# Patient Record
Sex: Female | Born: 1965 | Race: White | Hispanic: No | Marital: Single | Smoking: Never smoker
Health system: Southern US, Community
[De-identification: ages and names within clinical notes are randomized; demographics above are authoritative.]

## PROBLEM LIST (undated history)

## (undated) DIAGNOSIS — G510 Bell's palsy: Secondary | ICD-10-CM

## (undated) DIAGNOSIS — R42 Dizziness and giddiness: Secondary | ICD-10-CM

## (undated) DIAGNOSIS — Z8619 Personal history of other infectious and parasitic diseases: Secondary | ICD-10-CM

## (undated) DIAGNOSIS — C44300 Unspecified malignant neoplasm of skin of unspecified part of face: Secondary | ICD-10-CM

## (undated) DIAGNOSIS — J329 Chronic sinusitis, unspecified: Secondary | ICD-10-CM

## (undated) DIAGNOSIS — J3089 Other allergic rhinitis: Secondary | ICD-10-CM

## (undated) DIAGNOSIS — E7800 Pure hypercholesterolemia, unspecified: Secondary | ICD-10-CM

## (undated) HISTORY — DX: Pure hypercholesterolemia, unspecified: E78.00

## (undated) HISTORY — DX: Dizziness and giddiness: R42

## (undated) HISTORY — DX: Chronic sinusitis, unspecified: J32.9

## (undated) HISTORY — DX: Personal history of other infectious and parasitic diseases: Z86.19

## (undated) HISTORY — DX: Other allergic rhinitis: J30.89

## (undated) HISTORY — DX: Unspecified malignant neoplasm of skin of unspecified part of face: C44.300

---
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
## 1999-10-20 ENCOUNTER — Other Ambulatory Visit: Admission: RE | Admit: 1999-10-20 | Discharge: 1999-10-20 | Payer: Self-pay | Admitting: Family Medicine

## 2008-03-26 HISTORY — PX: SHOULDER SURGERY: SHX246

## 2012-06-25 DIAGNOSIS — E7800 Pure hypercholesterolemia, unspecified: Secondary | ICD-10-CM

## 2012-06-25 HISTORY — DX: Pure hypercholesterolemia, unspecified: E78.00

## 2012-08-28 ENCOUNTER — Encounter: Payer: Self-pay | Admitting: Certified Nurse Midwife

## 2012-10-12 ENCOUNTER — Encounter: Payer: Self-pay | Admitting: Certified Nurse Midwife

## 2012-10-12 ENCOUNTER — Ambulatory Visit (INDEPENDENT_AMBULATORY_CARE_PROVIDER_SITE_OTHER): Payer: Federal, State, Local not specified - PPO | Admitting: Certified Nurse Midwife

## 2012-10-12 VITALS — BP 100/64 | Ht 70.25 in | Wt 183.0 lb

## 2012-10-12 DIAGNOSIS — Z0000 Encounter for general adult medical examination without abnormal findings: Secondary | ICD-10-CM

## 2012-10-12 NOTE — Progress Notes (Addendum)
47 y.o. SingleCaucasian female   G0P0000 here for annual exam.    Patient's last menstrual period was 09/25/2012.          Sexually active: no  The current method of family planning is none.    Exercising: no  Home exercise routine includes none. Last mammogram:  11/02/2011 Last pap smear:10/12/11 Smoking:no Alcohol:no Last colonoscopy:no Last Bone Density:  no  Hgb:   pcp             Urine:pcp    No health maintenance topics applied.  Family History  Problem Relation Age of Onset  . Hypertension Father   . Gout Father   . Heart disease Mother   . Stroke Mother     There is no problem list on file for this patient.   History reviewed. No pertinent past medical history.  Past Surgical History  Procedure Laterality Date  . Shoulder surgery Left 03/26/2008  . Urethra surgery  07/26/1970    Allergies: Nyquil  Current Outpatient Prescriptions  Medication Sig Dispense Refill  . cetirizine (ZYRTEC) 10 MG tablet Take 10 mg by mouth daily.      . pravastatin (PRAVACHOL) 10 MG tablet Take 10 mg by mouth daily.       No current facility-administered medications for this visit.    ROS: Pertinent items are noted in HPI.  Exam:    BP 100/64  Ht 5' 10.25" (1.784 m)  Wt 183 lb (83.008 kg)  BMI 26.08 kg/m2  LMP 09/25/2012   Wt Readings from Last 3 Encounters:  10/12/12 183 lb (83.008 kg)     Ht Readings from Last 3 Encounters:  10/12/12 5' 10.25" (1.784 m)    General appearance: alert, cooperative and appears stated age Head: Normocephalic, without obvious abnormality, atraumatic Neck: no adenopathy, supple, symmetrical, trachea midline and thyroid not enlarged, symmetric, no tenderness/mass/nodules Lungs: clear to auscultation bilaterally Breasts: Inspection negative, No nipple retraction or dimpling, No nipple discharge or bleeding, No axillary or supraclavicular adenopathy, Normal to palpation without dominant masses Heart: regular rate and rhythm Abdomen: soft,  non-tender; bowel sounds normal; no masses,  no organomegaly Extremities: extremities normal, atraumatic, no cyanosis or edema Skin: Skin color, texture, turgor normal. No rashes or lesions Lymph nodes: Cervical, supraclavicular, and axillary nodes normal. No abnormal inguinal nodes palpated Neurologic: Grossly normal   Pelvic: External genitalia:  no lesions              Urethra:  normal appearing urethra with no masses, tenderness or lesions              Bartholins and Skenes: normal                 Vagina: normal appearing vagina with normal color and discharge, no lesions              Cervix: normal appearance              Pap taken: no        Bimanual Exam:  Uterus:  uterus is normal size, shape, consistency and nontender                                      Adnexa: normal adnexa in size, nontender and no masses  Rectovaginal: Confirms                                      Anus:  normal sphincter tone, no lesions  A: well woman exam     P:Reviewed health and wellness per patient instructions.  Mammogram in April 2014 pap smear return annually or prn     An After Visit Summary was printed and given to the patient.   Reviewed, TL

## 2013-05-01 ENCOUNTER — Ambulatory Visit (INDEPENDENT_AMBULATORY_CARE_PROVIDER_SITE_OTHER): Payer: Federal, State, Local not specified - PPO | Admitting: Podiatry

## 2013-05-01 ENCOUNTER — Encounter: Payer: Self-pay | Admitting: Podiatry

## 2013-05-01 VITALS — BP 122/76 | HR 64 | Temp 97.6°F | Resp 12 | Ht 71.0 in | Wt 174.0 lb

## 2013-05-01 DIAGNOSIS — B353 Tinea pedis: Secondary | ICD-10-CM | POA: Insufficient documentation

## 2013-05-01 NOTE — Progress Notes (Signed)
Olivia Hill presents today for her second laser toenail procedure to the second digit of the right foot. She states it seems like it is getting better. She states it feels much better than it did.  Objective: Second toe demonstrates some nail change proximally. There does seem to be some improvement.  Assessment: Visible improvement of the nail plate second digit right foot.  Plan: Second laser treatment today consists of 300 shocks to the second digit right foot. Patient tolerated this well. We'll followup with her in 5 months for her third round of laser.

## 2013-10-02 ENCOUNTER — Ambulatory Visit (INDEPENDENT_AMBULATORY_CARE_PROVIDER_SITE_OTHER): Payer: Federal, State, Local not specified - PPO | Admitting: Podiatry

## 2013-10-02 DIAGNOSIS — M79609 Pain in unspecified limb: Secondary | ICD-10-CM

## 2013-10-02 NOTE — Progress Notes (Signed)
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 She states it really doesn't seem to be doing very well with the laser therapy. I am considering not doing it today. Objective evaluation reveals that there is no change in nail plates as initiation of laser therapy. At this point I suggested that laser therapy would probably be less than effective and suggested other means of debridement. We will followup with her as needed.

## 2013-10-15 ENCOUNTER — Ambulatory Visit (INDEPENDENT_AMBULATORY_CARE_PROVIDER_SITE_OTHER): Payer: Federal, State, Local not specified - PPO | Admitting: Certified Nurse Midwife

## 2013-10-15 ENCOUNTER — Encounter: Payer: Self-pay | Admitting: Certified Nurse Midwife

## 2013-10-15 VITALS — BP 111/70 | HR 66 | Resp 16 | Ht 71.0 in | Wt 188.0 lb

## 2013-10-15 DIAGNOSIS — Z01419 Encounter for gynecological examination (general) (routine) without abnormal findings: Secondary | ICD-10-CM

## 2013-10-15 DIAGNOSIS — Z0000 Encounter for general adult medical examination without abnormal findings: Secondary | ICD-10-CM

## 2013-10-15 LAB — POCT URINALYSIS DIPSTICK
Bilirubin, UA: NEGATIVE
Blood, UA: NEGATIVE
Glucose, UA: NEGATIVE
Ketones, UA: NEGATIVE
Leukocytes, UA: NEGATIVE
Nitrite, UA: NEGATIVE
Protein, UA: NEGATIVE
Urobilinogen, UA: NEGATIVE
pH, UA: 5

## 2013-10-15 NOTE — Patient Instructions (Signed)

## 2013-10-15 NOTE — Progress Notes (Signed)
Reviewed personally.  M. Suzanne Naamah Boggess, MD.  

## 2013-10-15 NOTE — Progress Notes (Signed)
48 y.o. G0P0000 Single Caucasian Fe here for annual exam. Perimenopausal with regular periods until this period. Duration 5-8 days, heavy with light bleeding until stopped. Some hot flashes, and night sweats, but no insomnia issues. Working on establishing a exercise and diet program once she is in [place]. She will be relocating for the next year and is so excited! Sees PCP with employment for aex, labs.  No other health issues today.  Patient's last menstrual period was 09/03/2013.          Sexually active: no  The current method of family planning is none.    Exercising: no  exercise Smoker:  no  Health Maintenance: Pap: 10-12-11 neg HPV HR neg MMG:  11-16-2012 normal Colonoscopy: none BMD:   none TDaP:  2013 Labs: Poct urine-neg, Hgb-13.3 Self breast exam: done occ   reports that she has never smoked. She does not have any smokeless tobacco history on file. She reports that she does not drink alcohol or use illicit drugs.  Past Medical History  Diagnosis Date  . Hypercholesterolemia 12/13    on medication    Past Surgical History  Procedure Laterality Date  . Shoulder surgery Left 03/26/2008  . Urethra surgery  07/26/1970    Current Outpatient Prescriptions  Medication Sig Dispense Refill  . cetirizine (ZYRTEC) 10 MG tablet Take 10 mg by mouth daily.       No current facility-administered medications for this visit.    Family History  Problem Relation Age of Onset  . Hypertension Father   . Gout Father   . Heart disease Mother   . Stroke Mother     ROS:  Pertinent items are noted in HPI.  Otherwise, a comprehensive ROS was negative.  Exam:   BP 111/70  Pulse 66  Resp 16  Ht 5\' 11"  (1.803 m)  Wt 188 lb (85.276 kg)  BMI 26.23 kg/m2  LMP 09/03/2013 Height: 5\' 11"  (180.3 cm)  Ht Readings from Last 3 Encounters:  10/15/13 5\' 11"  (1.803 m)  05/01/13 5\' 11"  (1.803 m)  10/12/12 5' 10.25" (1.784 m)    General appearance: alert, cooperative and appears stated  age Head: Normocephalic, without obvious abnormality, atraumatic Neck: no adenopathy, supple, symmetrical, trachea midline and thyroid normal to inspection and palpation and non-palpable Lungs: clear to auscultation bilaterally Breasts: normal appearance, no masses or tenderness, No nipple retraction or dimpling, No nipple discharge or bleeding, No axillary or supraclavicular adenopathy Heart: regular rate and rhythm Abdomen: soft, non-tender; no masses,  no organomegaly Extremities: extremities normal, atraumatic, no cyanosis or edema Skin: Skin color, texture, turgor normal. No rashes or lesions Lymph nodes: Cervical, supraclavicular, and axillary nodes normal. No abnormal inguinal nodes palpated Neurologic: Grossly normal   Pelvic: External genitalia:  no lesions              Urethra:  normal appearing urethra with no masses, tenderness or lesions              Bartholin's and Skene's: normal                 Vagina: normal appearing vagina with normal color and discharge, no lesions              Cervix: normal,non tender              Pap taken: yes Bimanual Exam:  Uterus:  normal size, contour, position, consistency, mobility, non-tender and anteverted  Adnexa: normal adnexa and no mass, fullness, tenderness               Rectovaginal: Confirms               Anus:  normal sphincter tone, no lesions  A:  Well Woman with normal exam  Contraception not needed, female/female relationship  Perimenopausal with cycle changes, no amenorrhea  P:   Reviewed health and wellness pertinent to exam  Discussed continue keeping menses record with normal and abnormal parameters. Patient to advise if any changes.  Pap smear as per guidelines   Mammogram yearly pap smear taken today with reflex counseled on mammography screening, adequate intake of calcium and vitamin D, diet and exercise encouraged, with walking and bike riding in [place].  return annually or prn Wished well on  her relocation.  An After Visit Summary was printed and given to the patient.

## 2013-10-17 LAB — IPS PAP TEST WITH REFLEX TO HPV

## 2013-10-23 ENCOUNTER — Ambulatory Visit (INDEPENDENT_AMBULATORY_CARE_PROVIDER_SITE_OTHER): Payer: Federal, State, Local not specified - PPO | Admitting: Podiatry

## 2013-10-23 VITALS — BP 119/73 | HR 70 | Resp 16

## 2013-10-23 DIAGNOSIS — B351 Tinea unguium: Secondary | ICD-10-CM

## 2013-10-23 DIAGNOSIS — M79609 Pain in unspecified limb: Secondary | ICD-10-CM

## 2013-10-23 MED ORDER — TAVABOROLE 5 % EX SOLN: CUTANEOUS | Status: DC

## 2013-10-23 NOTE — Progress Notes (Signed)
She presents today for a laser treatment in digital nail plate right foot. The nail was debrided and thin. 500 shocks from the laser was utilized. She was also given a prescription for [facility]. I will followup with her in about 4 months

## 2014-11-01 ENCOUNTER — Ambulatory Visit (INDEPENDENT_AMBULATORY_CARE_PROVIDER_SITE_OTHER): Payer: Federal, State, Local not specified - PPO | Admitting: Certified Nurse Midwife

## 2014-11-01 ENCOUNTER — Encounter: Payer: Self-pay | Admitting: Certified Nurse Midwife

## 2014-11-01 VITALS — BP 108/80 | HR 68 | Resp 16 | Ht 70.5 in | Wt 197.0 lb

## 2014-11-01 DIAGNOSIS — Z0000 Encounter for general adult medical examination without abnormal findings: Secondary | ICD-10-CM | POA: Diagnosis not present

## 2014-11-01 DIAGNOSIS — Z01419 Encounter for gynecological examination (general) (routine) without abnormal findings: Secondary | ICD-10-CM

## 2014-11-01 DIAGNOSIS — N912 Amenorrhea, unspecified: Secondary | ICD-10-CM | POA: Diagnosis not present

## 2014-11-01 DIAGNOSIS — N951 Menopausal and female climacteric states: Secondary | ICD-10-CM | POA: Diagnosis not present

## 2014-11-01 LAB — COMPREHENSIVE METABOLIC PANEL
ALT: 22 U/L (ref 0–35)
AST: 18 U/L (ref 0–37)
Albumin: 4.5 g/dL (ref 3.5–5.2)
Alkaline Phosphatase: 95 U/L (ref 39–117)
BUN: 16 mg/dL (ref 6–23)
CO2: 28 mEq/L (ref 19–32)
Calcium: 9.6 mg/dL (ref 8.4–10.5)
Chloride: 103 mEq/L (ref 96–112)
Creat: 0.86 mg/dL (ref 0.50–1.10)
Glucose, Bld: 82 mg/dL (ref 70–99)
Potassium: 4.1 mEq/L (ref 3.5–5.3)
Sodium: 140 mEq/L (ref 135–145)
Total Bilirubin: 0.4 mg/dL (ref 0.2–1.2)
Total Protein: 7.1 g/dL (ref 6.0–8.3)

## 2014-11-01 LAB — POCT URINALYSIS DIPSTICK
Bilirubin, UA: NEGATIVE
Blood, UA: NEGATIVE
Glucose, UA: NEGATIVE
Ketones, UA: NEGATIVE
Leukocytes, UA: NEGATIVE
Nitrite, UA: NEGATIVE
Protein, UA: NEGATIVE
Urobilinogen, UA: NEGATIVE
pH, UA: 5

## 2014-11-01 LAB — TSH: TSH: 2.495 u[IU]/mL (ref 0.350–4.500)

## 2014-11-01 LAB — LIPID PANEL
Cholesterol: 219 mg/dL — ABNORMAL HIGH (ref 0–200)
HDL: 40 mg/dL — ABNORMAL LOW (ref >=46)
LDL Cholesterol: 134 mg/dL — ABNORMAL HIGH (ref 0–99)
Total CHOL/HDL Ratio: 5.5 Ratio
Triglycerides: 223 mg/dL — ABNORMAL HIGH (ref <150)
VLDL: 45 mg/dL — ABNORMAL HIGH (ref 0–40)

## 2014-11-01 NOTE — Patient Instructions (Signed)

## 2014-11-01 NOTE — Progress Notes (Signed)
49 y.o. G0P0000 Single  Caucasian Fe here for annual exam. Periods normal until 12/15. Has had no period since then. Occasional hot flashes and night sweats.  No vaginal dryness. Patient now back in [place] after living and working in [place] for the past year. Sees PCP prn does not go annually. Request screening labs. Patient has noted a ? Fat change on right inner thigh. Not tender, no redness, just looks different, but has gained weight since living in [place]. No pain or tenderness. No other health issues today.  Patient's last menstrual period was 06/25/2014.          Sexually active: No.  The current method of family planning is abstinence.    Exercising: Yes.    Walking 2-7 miles a day  Smoker:  no  Health Maintenance: Pap: 10/15/13 Neg MMG: 10/2014 Normal  Self Breast Check: yes, occ Colonoscopy:  Never BMD:   Never TDaP: 2013  Labs:  Poct urine-neg, Hgb: 13.8   reports that she has never smoked. She has never used smokeless tobacco. She reports that she does not drink alcohol or use illicit drugs.  Past Medical History  Diagnosis Date  . Hypercholesterolemia 12/13    on medication  . Skin cancer of face     cryotherapy done fall 2015    Past Surgical History  Procedure Laterality Date  . Shoulder surgery Left 03/26/2008  . Urethra surgery  07/26/1970    Current Outpatient Prescriptions  Medication Sig Dispense Refill  . cetirizine (ZYRTEC) 10 MG tablet Take 10 mg by mouth daily.     No current facility-administered medications for this visit.    Family History  Problem Relation Age of Onset  . Hypertension Father   . Gout Father   . Heart disease Mother   . Stroke Mother     ROS:  Pertinent items are noted in HPI.  Otherwise, a comprehensive ROS was negative.  Exam:   BP 108/80 mmHg  Pulse 68  Resp 16  Ht 5' 10.5" (1.791 m)  Wt 197 lb (89.359 kg)  BMI 27.86 kg/m2  LMP 06/25/2014 Height: 5' 10.5" (179.1 cm) Ht Readings from Last 3 Encounters:  11/01/14 5'  10.5" (1.791 m)  10/15/13 5\' 11"  (1.803 m)  05/01/13 5\' 11"  (1.803 m)    General appearance: alert, cooperative and appears stated age Head: Normocephalic, without obvious abnormality, atraumatic Neck: no adenopathy, supple, symmetrical, trachea midline and thyroid normal to inspection and palpation Lungs: clear to auscultation bilaterally Breasts: normal appearance, no masses or tenderness, No nipple retraction or dimpling, No nipple discharge or bleeding, No axillary or supraclavicular adenopathy Heart: regular rate and rhythm Abdomen: soft, non-tender; no masses,  no organomegaly Extremities: extremities normal, atraumatic, no cyanosis or edema, no lipoma noted on inner right thigh just normal appearance. Pulses equal and no edema noted.  Skin: Skin color, texture, turgor normal. No rashes or lesions Lymph nodes: Cervical, supraclavicular, and axillary nodes normal. No abnormal inguinal nodes palpated Neurologic: Grossly normal   Pelvic: External genitalia:  no lesions              Urethra:  normal appearing urethra with no masses, tenderness or lesions              Bartholin's and Skene's: normal                 Vagina: normal appearing vagina with normal color and discharge, no lesions  Cervix: normal,non tender, no lesions              Pap taken: No. Bimanual Exam:  Uterus:  normal size, contour, position, consistency, mobility, non-tender              Adnexa: normal adnexa and no mass, fullness, tenderness               Rectovaginal: Confirms               Anus:  normal sphincter tone, no lesions    A:  Well Woman with normal exam  Contraception, not sexually active ever  Perimenopausal with amenorrhea  Normal leg appearance and color  Screening labs    P:   Reviewed health and wellness pertinent to exam  Discussed perimenopausal changes and etiology. Discussed expectations with cycle changes. Discussed Thyroid and Pituitary and weight change can also cause  cycle changes. Recommend lab work for further evaluation. Agreeable. Discussed Provera challenge if needed. Patient agreeable. Continue menses calendar.  Labs TSH,FSH,Prolactin, Lipid panel, CMP, Vitamin D  Pap smear not taken today  Reassured leg has normal appearance, will come in if change   counseled on breast self exam, mammography screening, menopause, adequate intake of calcium and vitamin D, diet and exercise  return annually or prn  An After Visit Summary was printed and given to the patient.

## 2014-11-02 ENCOUNTER — Other Ambulatory Visit: Payer: Self-pay | Admitting: Certified Nurse Midwife

## 2014-11-02 DIAGNOSIS — R899 Unspecified abnormal finding in specimens from other organs, systems and tissues: Secondary | ICD-10-CM

## 2014-11-02 DIAGNOSIS — N912 Amenorrhea, unspecified: Secondary | ICD-10-CM

## 2014-11-02 LAB — FOLLICLE STIMULATING HORMONE: FSH: 77.8 m[IU]/mL

## 2014-11-02 LAB — VITAMIN D 25 HYDROXY (VIT D DEFICIENCY, FRACTURES): Vit D, 25-Hydroxy: 17 ng/mL — ABNORMAL LOW (ref 30–100)

## 2014-11-02 LAB — PROLACTIN: Prolactin: 5.2 ng/mL

## 2014-11-02 MED ORDER — MEDROXYPROGESTERONE ACETATE 10 MG PO TABS: 10.0000 mg | ORAL_TABLET | Freq: Every day | ORAL | Status: DC

## 2014-11-04 ENCOUNTER — Ambulatory Visit: Payer: Federal, State, Local not specified - PPO | Admitting: Certified Nurse Midwife

## 2014-11-04 ENCOUNTER — Telehealth: Payer: Self-pay

## 2014-11-04 DIAGNOSIS — E559 Vitamin D deficiency, unspecified: Secondary | ICD-10-CM

## 2014-11-04 LAB — HEMOGLOBIN, FINGERSTICK: Hemoglobin, fingerstick: 13.8 g/dL (ref 12.0–16.0)

## 2014-11-04 NOTE — Telephone Encounter (Signed)
Lmtcb. Needed to give patient lab results

## 2014-11-04 NOTE — Telephone Encounter (Signed)
Returning call.

## 2014-11-05 ENCOUNTER — Other Ambulatory Visit: Payer: Self-pay

## 2014-11-05 DIAGNOSIS — E559 Vitamin D deficiency, unspecified: Secondary | ICD-10-CM

## 2014-11-05 MED ORDER — VITAMIN D (ERGOCALCIFEROL) 1.25 MG (50000 UNIT) PO CAPS: 50000.0000 [IU] | ORAL_CAPSULE | ORAL | Status: DC

## 2014-11-05 NOTE — Telephone Encounter (Signed)
Patient notified of results as written by provider 

## 2014-11-08 ENCOUNTER — Telehealth: Payer: Self-pay | Admitting: Certified Nurse Midwife

## 2014-11-08 NOTE — Telephone Encounter (Signed)
Spoke with patient. She states at recent visit, Debbi assessed an area on inner right thigh that she was advised is normal in appearance. Patient states that she is traveling to [place] on 12/03/14 and wondering if long flight is safe. Advised patient if has concerns about area, can be evaluated by PCP to advise if air travel is safe or is needs imaging to area. Patient states she is okay with doing nothing but wanted provider opinion.   Advised would send to provider and return call with response.

## 2014-11-08 NOTE — Telephone Encounter (Signed)
Patient has a question for a nurse,no details given. Last seen 11/01/14.

## 2014-11-10 NOTE — Progress Notes (Signed)
Reviewed personally.  M. Suzanne Evanell Redlich, MD.  

## 2014-11-11 NOTE — Telephone Encounter (Signed)
I discussed with patient uncertain as to what this was ? Lipoma, would need to see PCP for evaluation if any change.

## 2014-11-11 NOTE — Telephone Encounter (Signed)
Spoke with patient and message from Regina Eck CNM given.  Patient agreeable. She will schedule appointment to see PCP for evaluation.  Routing to provider for final review. Patient agreeable to disposition. Will close encounter

## 2014-11-28 ENCOUNTER — Telehealth: Payer: Self-pay | Admitting: Certified Nurse Midwife

## 2014-11-28 NOTE — Telephone Encounter (Signed)
Spoke with patient. Patient states that she complete taking Provera 14 days ago today and has not had any bleeding. Advised patient will let Regina Eck CNM know and return call with any further recommendations. Patient is agreeable.

## 2014-11-28 NOTE — Telephone Encounter (Signed)
Patient calling to update nurse about the medicine she took to start her menstrual cycle. She reports, "Nothing happened and I am okay with that."

## 2014-11-28 NOTE — Telephone Encounter (Signed)
Patient needs to continue menstrual calendar and advise if no period in 3 months.

## 2014-11-29 NOTE — Telephone Encounter (Signed)
Spoke with patient. Advised of message as seen below from [place]. Patient is agreeable and verbalizes understanding.  Routing to provider for final review. Patient agreeable to disposition. Patient aware provider will review message and nurse will return call with any additional instructions or change of disposition. Will close encounter.

## 2014-11-29 NOTE — Telephone Encounter (Signed)
agree

## 2015-02-04 ENCOUNTER — Other Ambulatory Visit (INDEPENDENT_AMBULATORY_CARE_PROVIDER_SITE_OTHER): Payer: Federal, State, Local not specified - PPO

## 2015-02-04 DIAGNOSIS — E559 Vitamin D deficiency, unspecified: Secondary | ICD-10-CM

## 2015-02-04 DIAGNOSIS — R899 Unspecified abnormal finding in specimens from other organs, systems and tissues: Secondary | ICD-10-CM

## 2015-02-04 LAB — LIPID PANEL
Cholesterol: 228 mg/dL — ABNORMAL HIGH (ref 0–200)
HDL: 44 mg/dL — ABNORMAL LOW (ref >=46)
LDL Cholesterol: 153 mg/dL — ABNORMAL HIGH (ref 0–99)
Total CHOL/HDL Ratio: 5.2 Ratio
Triglycerides: 153 mg/dL — ABNORMAL HIGH (ref <150)
VLDL: 31 mg/dL (ref 0–40)

## 2015-02-05 ENCOUNTER — Telehealth: Payer: Self-pay

## 2015-02-05 LAB — VITAMIN D 25 HYDROXY (VIT D DEFICIENCY, FRACTURES): Vit D, 25-Hydroxy: 31 ng/mL (ref 30–100)

## 2015-02-05 NOTE — Telephone Encounter (Signed)
I spoke to patient & told her to call her pcp office & have them call them & get her an earlier appt. Patient agreed to do that.

## 2015-02-05 NOTE — Telephone Encounter (Signed)
I called patient & gave her lab results. Pt states she has a pcp that is moving to [place] next week & she had an u/s done on her thyroid that showed cyst. Pt states that the earliest endocrinologist appt she could get is in 49mths. Pt asking if you could maybe get her into see an endocrinologist sooner. Please advise.

## 2015-02-05 NOTE — Telephone Encounter (Signed)
Because they saw her they would have to do the referral.

## 2015-02-14 DIAGNOSIS — E041 Nontoxic single thyroid nodule: Secondary | ICD-10-CM | POA: Insufficient documentation

## 2015-04-02 DIAGNOSIS — Z8709 Personal history of other diseases of the respiratory system: Secondary | ICD-10-CM | POA: Insufficient documentation

## 2015-04-02 DIAGNOSIS — J329 Chronic sinusitis, unspecified: Secondary | ICD-10-CM

## 2015-04-09 ENCOUNTER — Telehealth: Payer: Self-pay | Admitting: Certified Nurse Midwife

## 2015-04-09 NOTE — Telephone Encounter (Signed)
Patient calling to give update on her cycle, patient says she just finished spotting that started 04/04/15. She wanted Ms. Debbie to know.

## 2015-04-10 NOTE — Telephone Encounter (Signed)
Please call patient and have her advise if no period in next 3 months

## 2015-04-10 NOTE — Telephone Encounter (Signed)
Left message on machine letting patient know. DPR signed.

## 2015-05-09 ENCOUNTER — Ambulatory Visit: Payer: Federal, State, Local not specified - PPO | Admitting: Allergy and Immunology

## 2015-06-04 ENCOUNTER — Ambulatory Visit (INDEPENDENT_AMBULATORY_CARE_PROVIDER_SITE_OTHER): Payer: Federal, State, Local not specified - PPO | Admitting: Internal Medicine

## 2015-06-04 ENCOUNTER — Encounter: Payer: Self-pay | Admitting: Internal Medicine

## 2015-06-04 VITALS — BP 118/82 | Temp 97.9°F | Ht 70.0 in | Wt 202.6 lb

## 2015-06-04 DIAGNOSIS — E041 Nontoxic single thyroid nodule: Secondary | ICD-10-CM | POA: Diagnosis not present

## 2015-06-04 DIAGNOSIS — R0683 Snoring: Secondary | ICD-10-CM

## 2015-06-04 DIAGNOSIS — J3089 Other allergic rhinitis: Secondary | ICD-10-CM | POA: Diagnosis not present

## 2015-06-04 DIAGNOSIS — E7800 Pure hypercholesterolemia, unspecified: Secondary | ICD-10-CM

## 2015-06-04 NOTE — Patient Instructions (Signed)
Agree with fu of   Thyroid  In January  2017 .   This is a nodule that could be biopsy.  Continue  saline spray  .  Consider adding  Flonase, nasacort  every day  may help.  Agree with healthy weight loss ,   Could help sleep and snoring .  At some point consider  Consult with sleep specialist .  We have  Sleep certified  Docs in our  Pulmonary department.    Health Maintenance, Female Adopting a healthy lifestyle and getting preventive care can go a long way to promote health and wellness. Talk with your health care provider about what schedule of regular examinations is right for you. This is a good chance for you to check in with your provider about disease prevention and staying healthy. In between checkups, there are plenty of things you can do on your own. Experts have done a lot of research about which lifestyle changes and preventive measures are most likely to keep you healthy. Ask your health care provider for more information. WEIGHT AND DIET  Eat a healthy diet  Be sure to include plenty of vegetables, fruits, low-fat dairy products, and lean protein.  Do not eat a lot of foods high in solid fats, added sugars, or salt.  Get regular exercise. This is one of the most important things you can do for your health.  Most adults should exercise for at least 150 minutes each week. The exercise should increase your heart rate and make you sweat (moderate-intensity exercise).  Most adults should also do strengthening exercises at least twice a week. This is in addition to the moderate-intensity exercise.  Maintain a healthy weight  Body mass index (BMI) is a measurement that can be used to identify possible weight problems. It estimates body fat based on height and weight. Your health care provider can help determine your BMI and help you achieve or maintain a healthy weight.  For females 41 years of age and older:   A BMI below 18.5 is considered underweight.  A BMI of 18.5 to  24.9 is normal.  A BMI of 25 to 29.9 is considered overweight.  A BMI of 30 and above is considered obese.  Watch levels of cholesterol and blood lipids  You should start having your blood tested for lipids and cholesterol at 49 years of age, then have this test every 5 years.  You may need to have your cholesterol levels checked more often if:  Your lipid or cholesterol levels are high.  You are older than 49 years of age.  You are at high risk for heart disease.  CANCER SCREENING   Lung Cancer  Lung cancer screening is recommended for adults 47-79 years old who are at high risk for lung cancer because of a history of smoking.  A yearly low-dose CT scan of the lungs is recommended for people who:  Currently smoke.  Have quit within the past 15 years.  Have at least a 30-pack-year history of smoking. A pack year is smoking an average of one pack of cigarettes a day for 1 year.  Yearly screening should continue until it has been 15 years since you quit.  Yearly screening should stop if you develop a health problem that would prevent you from having lung cancer treatment.  Breast Cancer  Practice breast self-awareness. This means understanding how your breasts normally appear and feel.  It also means doing regular breast self-exams. Let your health  care provider know about any changes, no matter how small.  If you are in your 20s or 30s, you should have a clinical breast exam (CBE) by a health care provider every 1-3 years as part of a regular health exam.  If you are 13 or older, have a CBE every year. Also consider having a breast X-ray (mammogram) every year.  If you have a family history of breast cancer, talk to your health care provider about genetic screening.  If you are at high risk for breast cancer, talk to your health care provider about having an MRI and a mammogram every year.  Breast cancer gene (BRCA) assessment is recommended for women who have family  members with BRCA-related cancers. BRCA-related cancers include:  Breast.  Ovarian.  Tubal.  Peritoneal cancers.  Results of the assessment will determine the need for genetic counseling and BRCA1 and BRCA2 testing. Cervical Cancer Your health care provider may recommend that you be screened regularly for cancer of the pelvic organs (ovaries, uterus, and vagina). This screening involves a pelvic examination, including checking for microscopic changes to the surface of your cervix (Pap test). You may be encouraged to have this screening done every 3 years, beginning at age 32.  For women ages 35-65, health care providers may recommend pelvic exams and Pap testing every 3 years, or they may recommend the Pap and pelvic exam, combined with testing for human papilloma virus (HPV), every 5 years. Some types of HPV increase your risk of cervical cancer. Testing for HPV may also be done on women of any age with unclear Pap test results.  Other health care providers may not recommend any screening for nonpregnant women who are considered low risk for pelvic cancer and who do not have symptoms. Ask your health care provider if a screening pelvic exam is right for you.  If you have had past treatment for cervical cancer or a condition that could lead to cancer, you need Pap tests and screening for cancer for at least 20 years after your treatment. If Pap tests have been discontinued, your risk factors (such as having a new sexual partner) need to be reassessed to determine if screening should resume. Some women have medical problems that increase the chance of getting cervical cancer. In these cases, your health care provider may recommend more frequent screening and Pap tests. Colorectal Cancer  This type of cancer can be detected and often prevented.  Routine colorectal cancer screening usually begins at 49 years of age and continues through 49 years of age.  Your health care provider may recommend  screening at an earlier age if you have risk factors for colon cancer.  Your health care provider may also recommend using home test kits to check for hidden blood in the stool.  A small camera at the end of a tube can be used to examine your colon directly (sigmoidoscopy or colonoscopy). This is done to check for the earliest forms of colorectal cancer.  Routine screening usually begins at age 74.  Direct examination of the colon should be repeated every 5-10 years through 49 years of age. However, you may need to be screened more often if early forms of precancerous polyps or small growths are found. Skin Cancer  Check your skin from head to toe regularly.  Tell your health care provider about any new moles or changes in moles, especially if there is a change in a mole's shape or color.  Also tell your health care  provider if you have a mole that is larger than the size of a pencil eraser.  Always use sunscreen. Apply sunscreen liberally and repeatedly throughout the day.  Protect yourself by wearing long sleeves, pants, a wide-brimmed hat, and sunglasses whenever you are outside. HEART DISEASE, DIABETES, AND HIGH BLOOD PRESSURE   High blood pressure causes heart disease and increases the risk of stroke. High blood pressure is more likely to develop in:  People who have blood pressure in the high end of the normal range (130-139/85-89 mm Hg).  People who are overweight or obese.  People who are African American.  If you are 18-56 years of age, have your blood pressure checked every 3-5 years. If you are 42 years of age or older, have your blood pressure checked every year. You should have your blood pressure measured twice--once when you are at a hospital or clinic, and once when you are not at a hospital or clinic. Record the average of the two measurements. To check your blood pressure when you are not at a hospital or clinic, you can use:  An automated blood pressure machine at a  pharmacy.  A home blood pressure monitor.  If you are between 30 years and 18 years old, ask your health care provider if you should take aspirin to prevent strokes.  Have regular diabetes screenings. This involves taking a blood sample to check your fasting blood sugar level.  If you are at a normal weight and have a low risk for diabetes, have this test once every three years after 49 years of age.  If you are overweight and have a high risk for diabetes, consider being tested at a younger age or more often. PREVENTING INFECTION  Hepatitis B  If you have a higher risk for hepatitis B, you should be screened for this virus. You are considered at high risk for hepatitis B if:  You were born in a country where hepatitis B is common. Ask your health care provider which countries are considered high risk.  Your parents were born in a high-risk country, and you have not been immunized against hepatitis B (hepatitis B vaccine).  You have HIV or AIDS.  You use needles to inject street drugs.  You live with someone who has hepatitis B.  You have had sex with someone who has hepatitis B.  You get hemodialysis treatment.  You take certain medicines for conditions, including cancer, organ transplantation, and autoimmune conditions. Hepatitis C  Blood testing is recommended for:  Everyone born from [birth year] through 1965.  Anyone with known risk factors for hepatitis C. Sexually transmitted infections (STIs)  You should be screened for sexually transmitted infections (STIs) including gonorrhea and chlamydia if:  You are sexually active and are younger than 49 years of age.  You are older than 49 years of age and your health care provider tells you that you are at risk for this type of infection.  Your sexual activity has changed since you were last screened and you are at an increased risk for chlamydia or gonorrhea. Ask your health care provider if you are at risk.  If you do not  have HIV, but are at risk, it may be recommended that you take a prescription medicine daily to prevent HIV infection. This is called pre-exposure prophylaxis (PrEP). You are considered at risk if:  You are sexually active and do not regularly use condoms or know the HIV status of your partner(s).  You take drugs  by injection.  You are sexually active with a partner who has HIV. Talk with your health care provider about whether you are at high risk of being infected with HIV. If you choose to begin PrEP, you should first be tested for HIV. You should then be tested every 3 months for as long as you are taking PrEP.  PREGNANCY   If you are premenopausal and you may become pregnant, ask your health care provider about preconception counseling.  If you may become pregnant, take 400 to 800 micrograms (mcg) of folic acid every day.  If you want to prevent pregnancy, talk to your health care provider about birth control (contraception). OSTEOPOROSIS AND MENOPAUSE   Osteoporosis is a disease in which the bones lose minerals and strength with aging. This can result in serious bone fractures. Your risk for osteoporosis can be identified using a bone density scan.  If you are 76 years of age or older, or if you are at risk for osteoporosis and fractures, ask your health care provider if you should be screened.  Ask your health care provider whether you should take a calcium or vitamin D supplement to lower your risk for osteoporosis.  Menopause may have certain physical symptoms and risks.  Hormone replacement therapy may reduce some of these symptoms and risks. Talk to your health care provider about whether hormone replacement therapy is right for you.  HOME CARE INSTRUCTIONS   Schedule regular health, dental, and eye exams.  Stay current with your immunizations.   Do not use any tobacco products including cigarettes, chewing tobacco, or electronic cigarettes.  If you are pregnant, do not  drink alcohol.  If you are breastfeeding, limit how much and how often you drink alcohol.  Limit alcohol intake to no more than 1 drink per day for nonpregnant women. One drink equals 12 ounces of beer, 5 ounces of wine, or 1 ounces of hard liquor.  Do not use street drugs.  Do not share needles.  Ask your health care provider for help if you need support or information about quitting drugs.  Tell your health care provider if you often feel depressed.  Tell your health care provider if you have ever been abused or do not feel safe at home.   This information is not intended to replace advice given to you by your health care provider. Make sure you discuss any questions you have with your health care provider.   Document Released: 01/25/2011 Document Revised: 08/02/2014 Document Reviewed: 06/13/2013 [employer]vier Interactive Patient Education [employer].

## 2015-06-04 NOTE — Progress Notes (Signed)
Pre visit review using our clinic review tool, if applicable. No additional management support is needed unless otherwise documented below in the visit note.   Chief Complaint  Patient presents with  . Establish Care    HPI: Patient  Olivia Hill  49 y.o. comes in today for  New patient Health Care visit  Previous care  Dr Reita Cliche who left practice  [facility]. To have new job relocation in [place] area    Works  Event organiser.   dentist did home osa test and said she could benefit from intervnetion  ESS was 9 is if positional ahi/rei 3.6amd 4.2  Not that interested yet. Would like to work on [place] had gained weight recent year .   THyroid  Noted [place] 11 mm   Left lower let pole nodules indeterminant  Advised bx of fu 6 months   immuniz utd ? Hep a 2  Has had anthrax and yellow fever and typhoid vaccine  Last pap 4 16 lmp 12 15 ;ast mammo 4 16  Health Maintenance  Topic Date Due  . HIV Screening  06/22/1981  . INFLUENZA VACCINE  02/24/2016  . PAP SMEAR  10/15/2016  . TETANUS/TDAP  07/26/2021   Health Maintenance Review LIFESTYLE:  Exercise:  y Tobacco/ETS:n Alcohol:  n Sugar beverages:n Sleep: y snoring  ? If has sleep apnea or not  Dentist said had risk  Drug use: no   ROS:  GEN/ HEENT: No fever, significant weight changes sweats headaches vision problems hearing changes, CV/ PULM; No chest pain shortness of breath cough, syncope,edema  change in exercise tolerance. GI /GU: No adominal pain, vomiting, change in bowel habits. No blood in the stool. No significant GU symptoms. SKIN/HEME: ,no acute skin rashes suspicious lesions or bleeding. No lymphadenopathy, nodules, masses.  NEURO/ PSYCH:  No neurologic signs such as weakness numbness. No depression anxiety. IMM/ Allergy: No unusual infections.  Allergy .   REST of 12 system review negative except as per HPI   Past Medical History  Diagnosis Date  . Hypercholesterolemia 12/13    on medication  .  Skin cancer of face     cryotherapy done fall 2015  . Hx of varicella   . Environmental and seasonal allergies   . Recurrent sinusitis     neg ct 2013     Past Surgical History  Procedure Laterality Date  . Shoulder surgery Left 03/26/2008    torn labrum 3 pins  . Urethra surgery  07/26/1970    Family History  Problem Relation Age of Onset  . Hypertension Father   . Gout Father   . Heart disease Mother   . Stroke Mother     in her 80s   . Migraines Mother   . Healthy Father     age 68  . Healthy Brother     2    Social History   Social History  . Marital Status: Single    Spouse Name: N/A  . Number of Children: N/A  . Years of Education: N/A   Social History Main Topics  . Smoking status: Never Smoker   . Smokeless tobacco: Never Used  . Alcohol Use: No  . Drug Use: No  . Sexual Activity: No   Other Topics Concern  . 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 fluticasone (FLONASE) 50 MCG/ACT nasal spray Place into both nostrils as needed for allergies or rhinitis.    Marland Kitchen loratadine (CLARITIN) 10 MG tablet Take 10 mg by mouth daily.    . naproxen sodium (ANAPROX) 220 MG tablet Take 220 mg by mouth as needed.    . cetirizine (ZYRTEC) 10 MG tablet Take 10 mg by mouth daily.    . medroxyPROGESTERone (PROVERA) 10 MG tablet Take 1 tablet (10 mg total) by mouth daily. 10 tablet 0  . Vitamin D, Ergocalciferol, (DRISDOL) 50000 UNITS CAPS capsule Take 1 capsule (50,000 Units total) by mouth every 7 (seven) days. 12 capsule 0   No facility-administered medications prior to visit.      Medication List       This list is accurate as of: 06/04/15 11:59 PM.  Always use your most recent med list.                fluticasone 50 MCG/ACT nasal spray  Commonly known as:  FLONASE  Place into both nostrils as needed for allergies or rhinitis.     loratadine 10 MG tablet  Commonly known as:  CLARITIN  Take 10 mg by mouth daily.     MULTIPLE VITAMIN PO  Take by mouth.     naproxen sodium 220 MG tablet  Commonly known as:  ANAPROX  Take 220 mg by mouth as needed.        EXAM:  BP 118/82 mmHg  Temp(Src) 97.9 F (36.6 C) (Oral)  Ht '5\' 10"'  (1.778 m)  Wt 202 lb 9.6 oz (91.899 kg)  BMI 29.07 kg/m2  LMP 06/25/2014  Body mass index is 29.07 kg/(m^2).  Physical Exam: Vital signs reviewed IZT:IWPY is a well-developed well-nourished alert cooperative    who appearsr stated age in no acute distress.  HEENT: normocephalic atraumatic , Eyes: PERRL EOM's full, conjunctiva clear, Nares: paten,t no deformity discharge or tenderness., Ears: no deformity EAC's clear TMs with normal landmarks. Mouth: clear OP, no lesions, edema.  Moist mucous membranes. Dentition in adequate repair. NECK: supple without masses thyroid palpable nor bruits. CHEST/PULM:  Clear to auscultation and percussion breath sounds equal no wheeze , rales or rhonchi. No chest wall deformities or tenderness. CV: PMI is nondisplaced, S1 S2 no gallops, murmurs, rubs. Peripheral pulses are full without delay..  ABDOMEN: Bowel sounds normal nontender  No guard or rebound, no hepato splenomegal no CVA tenderness.   Extremtities:  No clubbing cyanosis or edema, no acute joint swelling or redness no focal atrophy NEURO:  Oriented x3, cranial nerves 3-12 appear to be intact, no obvious focal weakness,gait within normal limits  SKIN: No acute rashes normal turgor, color, no bruising or petechiae. PSYCH: Oriented, good eye contact, no obvious depression anxiety, cognition and judgment appear normal. LN: no cervical  adenopathy  Lab Results  Component Value Date   GLUCOSE 82 11/01/2014   CHOL 228* 02/04/2015   TRIG 153* 02/04/2015   HDL 44*  02/04/2015   LDLCALC 153* 02/04/2015   ALT 22 11/01/2014   AST 18 11/01/2014   NA 140 11/01/2014   K 4.1 11/01/2014   CL 103 11/01/2014   CREATININE 0.86 11/01/2014   BUN 16 11/01/2014   CO2 28 11/01/2014   TSH 2.495 11/01/2014   Records reviewed  ASSESSMENT AND PLAN:  Discussed the following assessment and plan:  Thyroid nodule  Snoring  Hypercholesterolemia  Environmental and seasonal allergies Patient is moving NOv 27 adn will get back with [place] as the move in not permanent  We can continue to see her or give advice until she decides to transfer care .  Counseled regarding healthy nutrition, exercise, sleep, injury prevention, calcium vit d and healthy weight .  Patient Care Team: Burnis Medin, MD as PCP - General (Internal Medicine) Gean Quint, MD as Consulting Physician (Allergy and Immunology) Regina Eck, CNM as Referring Physician (Certified Nurse Midwife) Danella Sensing, MD as Consulting Physician (Dermatology) sandra fuller dds  Patient Instructions   Agree with fu of   Thyroid  In January  2017 .   This is a nodule that could be biopsy.  Continue  saline spray  .  Consider adding  Flonase, nasacort  every day  may help.  Agree with healthy weight loss ,   Could help sleep and snoring .  At some point consider  Consult with sleep specialist .  We have  Sleep certified  Docs in our  Pulmonary department.    Health Maintenance, Female Adopting a healthy lifestyle and getting preventive care can go a long way to promote health and wellness. Talk with your health care provider about what schedule of regular examinations is right for you. This is a good chance for you to check in with your provider about disease prevention and staying healthy. In between checkups, there are plenty of things you can do on your own. Experts have done a lot of research about which lifestyle changes and preventive measures are most likely to keep you healthy. Ask your health  care provider for more information. WEIGHT AND DIET  Eat a healthy diet  Be sure to include plenty of vegetables, fruits, low-fat dairy products, and lean protein.  Do not eat a lot of foods high in solid fats, added sugars, or salt.  Get regular exercise. This is one of the most important things you can do for your health.  Most adults should exercise for at least 150 minutes each week. The exercise should increase your heart rate and make you sweat (moderate-intensity exercise).  Most adults should also do strengthening exercises at least twice a week. This is in addition to the moderate-intensity exercise.  Maintain a healthy weight  Body mass index (BMI) is a measurement that can be used to identify possible weight problems. It estimates body fat based on height and weight. Your health care provider can help determine your BMI and help you achieve or maintain a healthy weight.  For females 85 years of age and older:   A BMI below 18.5 is considered underweight.  A BMI of 18.5 to 24.9 is normal.  A BMI of 25 to 29.9 is considered overweight.  A BMI of 30 and above is considered obese.  Watch levels of cholesterol and blood lipids  You should start having your blood tested for lipids and cholesterol at 49 years of age, then have this test every 5 years.  You may need to have your cholesterol levels checked more often if:  Your lipid or cholesterol levels are high.  You are older than 49 years of age.  You are at high risk for heart disease.  CANCER SCREENING   Lung Cancer  Lung cancer screening is recommended for adults 50-20 years old who are at high risk for lung  cancer because of a history of smoking.  A yearly low-dose CT scan of the lungs is recommended for people who:  Currently smoke.  Have quit within the past 15 years.  Have at least a 30-pack-year history of smoking. A pack year is smoking an average of one pack of cigarettes a day for 1  year.  Yearly screening should continue until it has been 15 years since you quit.  Yearly screening should stop if you develop a health problem that would prevent you from having lung cancer treatment.  Breast Cancer  Practice breast self-awareness. This means understanding how your breasts normally appear and feel.  It also means doing regular breast self-exams. Let your health care provider know about any changes, no matter how small.  If you are in your 20s or 30s, you should have a clinical breast exam (CBE) by a health care provider every 1-3 years as part of a regular health exam.  If you are 29 or older, have a CBE every year. Also consider having a breast X-ray (mammogram) every year.  If you have a family history of breast cancer, talk to your health care provider about genetic screening.  If you are at high risk for breast cancer, talk to your health care provider about having an MRI and a mammogram every year.  Breast cancer gene (BRCA) assessment is recommended for women who have family members with BRCA-related cancers. BRCA-related cancers include:  Breast.  Ovarian.  Tubal.  Peritoneal cancers.  Results of the assessment will determine the need for genetic counseling and BRCA1 and BRCA2 testing. Cervical Cancer Your health care provider may recommend that you be screened regularly for cancer of the pelvic organs (ovaries, uterus, and vagina). This screening involves a pelvic examination, including checking for microscopic changes to the surface of your cervix (Pap test). You may be encouraged to have this screening done every 3 years, beginning at age 81.  For women ages 1-65, health care providers may recommend pelvic exams and Pap testing every 3 years, or they may recommend the Pap and pelvic exam, combined with testing for human papilloma virus (HPV), every 5 years. Some types of HPV increase your risk of cervical cancer. Testing for HPV may also be done on  women of any age with unclear Pap test results.  Other health care providers may not recommend any screening for nonpregnant women who are considered low risk for pelvic cancer and who do not have symptoms. Ask your health care provider if a screening pelvic exam is right for you.  If you have had past treatment for cervical cancer or a condition that could lead to cancer, you need Pap tests and screening for cancer for at least 20 years after your treatment. If Pap tests have been discontinued, your risk factors (such as having a new sexual partner) need to be reassessed to determine if screening should resume. Some women have medical problems that increase the chance of getting cervical cancer. In these cases, your health care provider may recommend more frequent screening and Pap tests. Colorectal Cancer  This type of cancer can be detected and often prevented.  Routine colorectal cancer screening usually begins at 49 years of age and continues through 49 years of age.  Your health care provider may recommend screening at an earlier age if you have risk factors for colon cancer.  Your health care provider may also recommend using home test kits to check for hidden blood in the stool.  A small camera at the end of a tube can be used to examine your colon directly (sigmoidoscopy or colonoscopy). This is done to check for the earliest forms of colorectal cancer.  Routine screening usually begins at age 58.  Direct examination of the colon should be repeated every 5-10 years through 49 years of age. However, you may need to be screened more often if early forms of precancerous polyps or small growths are found. Skin Cancer  Check your skin from head to toe regularly.  Tell your health care provider about any new moles or changes in moles, especially if there is a change in a mole's shape or color.  Also tell your health care provider if you have a mole that is larger than the size of a  pencil eraser.  Always use sunscreen. Apply sunscreen liberally and repeatedly throughout the day.  Protect yourself by wearing long sleeves, pants, a wide-brimmed hat, and sunglasses whenever you are outside. HEART DISEASE, DIABETES, AND HIGH BLOOD PRESSURE   High blood pressure causes heart disease and increases the risk of stroke. High blood pressure is more likely to develop in:  People who have blood pressure in the high end of the normal range (130-139/85-89 mm Hg).  People who are overweight or obese.  People who are African American.  If you are 34-23 years of age, have your blood pressure checked every 3-5 years. If you are 47 years of age or older, have your blood pressure checked every year. You should have your blood pressure measured twice--once when you are at a hospital or clinic, and once when you are not at a hospital or clinic. Record the average of the two measurements. To check your blood pressure when you are not at a hospital or clinic, you can use:  An automated blood pressure machine at a pharmacy.  A home blood pressure monitor.  If you are between 15 years and 39 years old, ask your health care provider if you should take aspirin to prevent strokes.  Have regular diabetes screenings. This involves taking a blood sample to check your fasting blood sugar level.  If you are at a normal weight and have a low risk for diabetes, have this test once every three years after 49 years of age.  If you are overweight and have a high risk for diabetes, consider being tested at a younger age or more often. PREVENTING INFECTION  Hepatitis B  If you have a higher risk for hepatitis B, you should be screened for this virus. You are considered at high risk for hepatitis B if:  You were born in a country where hepatitis B is common. Ask your health care provider which countries are considered high risk.  Your parents were born in a high-risk country, and you have not been  immunized against hepatitis B (hepatitis B vaccine).  You have HIV or AIDS.  You use needles to inject street drugs.  You live with someone who has hepatitis B.  You have had sex with someone who has hepatitis B.  You get hemodialysis treatment.  You take certain medicines for conditions, including cancer, organ transplantation, and autoimmune conditions. Hepatitis C  Blood testing is recommended for:  Everyone born from [birth year] through 1965.  Anyone with known risk factors for hepatitis C. Sexually transmitted infections (STIs)  You should be screened for sexually transmitted infections (STIs) including gonorrhea and chlamydia if:  You are sexually active and are younger than 49 years of  age.  Dennis Bast are older than 49 years of age and your health care provider tells you that you are at risk for this type of infection.  Your sexual activity has changed since you were last screened and you are at an increased risk for chlamydia or gonorrhea. Ask your health care provider if you are at risk.  If you do not have HIV, but are at risk, it may be recommended that you take a prescription medicine daily to prevent HIV infection. This is called pre-exposure prophylaxis (PrEP). You are considered at risk if:  You are sexually active and do not regularly use condoms or know the HIV status of your partner(s).  You take drugs by injection.  You are sexually active with a partner who has HIV. Talk with your health care provider about whether you are at high risk of being infected with HIV. If you choose to begin PrEP, you should first be tested for HIV. You should then be tested every 3 months for as long as you are taking PrEP.  PREGNANCY   If you are premenopausal and you may become pregnant, ask your health care provider about preconception counseling.  If you may become pregnant, take 400 to 800 micrograms (mcg) of folic acid every day.  If you want to prevent pregnancy, talk to your  health care provider about birth control (contraception). OSTEOPOROSIS AND MENOPAUSE   Osteoporosis is a disease in which the bones lose minerals and strength with aging. This can result in serious bone fractures. Your risk for osteoporosis can be identified using a bone density scan.  If you are 3 years of age or older, or if you are at risk for osteoporosis and fractures, ask your health care provider if you should be screened.  Ask your health care provider whether you should take a calcium or vitamin D supplement to lower your risk for osteoporosis.  Menopause may have certain physical symptoms and risks.  Hormone replacement therapy may reduce some of these symptoms and risks. Talk to your health care provider about whether hormone replacement therapy is right for you.  HOME CARE INSTRUCTIONS   Schedule regular health, dental, and eye exams.  Stay current with your immunizations.   Do not use any tobacco products including cigarettes, chewing tobacco, or electronic cigarettes.  If you are pregnant, do not drink alcohol.  If you are breastfeeding, limit how much and how often you drink alcohol.  Limit alcohol intake to no more than 1 drink per day for nonpregnant women. One drink equals 12 ounces of beer, 5 ounces of wine, or 1 ounces of hard liquor.  Do not use street drugs.  Do not share needles.  Ask your health care provider for help if you need support or information about quitting drugs.  Tell your health care provider if you often feel depressed.  Tell your health care provider if you have ever been abused or do not feel safe at home.   This information is not intended to replace advice given to you by your health care provider. Make sure you discuss any questions you have with your health care provider.   Document Released: 01/25/2011 Document Revised: 08/02/2014 Document Reviewed: 06/13/2013 Elsevier Interactive Patient Education 2016 [place]. Kia Stavros M.D.

## 2015-06-15 ENCOUNTER — Encounter: Payer: Self-pay | Admitting: Internal Medicine

## 2015-06-15 DIAGNOSIS — R0683 Snoring: Secondary | ICD-10-CM | POA: Insufficient documentation

## 2015-06-15 DIAGNOSIS — E7800 Pure hypercholesterolemia, unspecified: Secondary | ICD-10-CM | POA: Insufficient documentation

## 2015-11-03 ENCOUNTER — Ambulatory Visit: Payer: Federal, State, Local not specified - PPO | Admitting: Certified Nurse Midwife

## 2015-11-05 ENCOUNTER — Other Ambulatory Visit: Payer: Self-pay | Admitting: Certified Nurse Midwife

## 2015-11-05 ENCOUNTER — Ambulatory Visit (INDEPENDENT_AMBULATORY_CARE_PROVIDER_SITE_OTHER): Payer: Federal, State, Local not specified - PPO | Admitting: Certified Nurse Midwife

## 2015-11-05 ENCOUNTER — Encounter: Payer: Self-pay | Admitting: Certified Nurse Midwife

## 2015-11-05 VITALS — BP 112/62 | HR 70 | Resp 16 | Ht 70.5 in | Wt 193.0 lb

## 2015-11-05 DIAGNOSIS — Z01419 Encounter for gynecological examination (general) (routine) without abnormal findings: Secondary | ICD-10-CM

## 2015-11-05 DIAGNOSIS — N950 Postmenopausal bleeding: Secondary | ICD-10-CM

## 2015-11-05 DIAGNOSIS — Z0000 Encounter for general adult medical examination without abnormal findings: Secondary | ICD-10-CM | POA: Diagnosis not present

## 2015-11-05 DIAGNOSIS — Z124 Encounter for screening for malignant neoplasm of cervix: Secondary | ICD-10-CM

## 2015-11-05 LAB — COMPREHENSIVE METABOLIC PANEL
ALT: 22 U/L (ref 6–29)
AST: 18 U/L (ref 10–35)
Albumin: 4.5 g/dL (ref 3.6–5.1)
Alkaline Phosphatase: 89 U/L (ref 33–115)
BUN: 22 mg/dL (ref 7–25)
CO2: 30 mmol/L (ref 20–31)
Calcium: 9.7 mg/dL (ref 8.6–10.2)
Chloride: 104 mmol/L (ref 98–110)
Creat: 0.79 mg/dL (ref 0.50–1.10)
Glucose, Bld: 102 mg/dL — ABNORMAL HIGH (ref 65–99)
Potassium: 4 mmol/L (ref 3.5–5.3)
Sodium: 142 mmol/L (ref 135–146)
Total Bilirubin: 0.3 mg/dL (ref 0.2–1.2)
Total Protein: 7.2 g/dL (ref 6.1–8.1)

## 2015-11-05 LAB — LIPID PANEL
Cholesterol: 212 mg/dL — ABNORMAL HIGH (ref 125–200)
HDL: 39 mg/dL — ABNORMAL LOW
LDL Cholesterol: 107 mg/dL
Total CHOL/HDL Ratio: 5.4 ratio — ABNORMAL HIGH
Triglycerides: 330 mg/dL — ABNORMAL HIGH
VLDL: 66 mg/dL — ABNORMAL HIGH

## 2015-11-05 NOTE — Patient Instructions (Signed)

## 2015-11-05 NOTE — Progress Notes (Signed)
50 y.o. G0P0000 Single  Caucasian Fe here for annual exam. Menopausal with light bleeding noted for 2 days 1/17 and very light on 10/04/15. Patient had amenorrhea since 12/1/ 2015 and had Provera challenge 11/28/14 with no withdrawal bleeding and FSH of 77.8 on 11/01/14. Denies any further bleeding until 1/17 and had no cramping with occurrence. Patient finally sold house and has moved residence to outside [place] area where she has been working for the past 2 years. Plans to work there until retirement and keep healthcare in [place]. Parents are still here and can stay with them. Has appt.with new PCP in 9/17, but would like do labs today. Still having occasional hot flash, but no issues. No other health issues today.    LMP:  10/04/15         Sexually active: No.  The current method of family planning is abstinence.    Exercising: No.  exercise Smoker:  no  Health Maintenance: Pap:  10-15-13 neg MMG:  10-02-15 category b density,birads 2:neg Colonoscopy:  none BMD:   none TDaP:  2013 Shingles: no Pneumonia: no Hep C and HIV: none Labs: none Self breast exam: done occ   reports that she has never smoked. She has never used smokeless tobacco. She reports that she does not drink alcohol or use illicit drugs.  Past Medical History  Diagnosis Date  . Hypercholesterolemia 12/13    on medication  . Skin cancer of face     cryotherapy done fall 2015  . Hx of varicella   . Environmental and seasonal allergies   . Recurrent sinusitis     neg ct 2013     Past Surgical History  Procedure Laterality Date  . Shoulder surgery Left 03/26/2008    torn labrum 3 pins  . Urethra surgery  07/26/1970    Current Outpatient Prescriptions  Medication Sig Dispense Refill  . fluticasone (FLONASE) 50 MCG/ACT nasal spray Place into both nostrils as needed for allergies or rhinitis.     No current facility-administered medications for this visit.    Family History  Problem Relation Age of Onset  .  Hypertension Father   . Gout Father   . Heart disease Mother   . Stroke Mother     in her 6s   . Migraines Mother   . Healthy Father     age 42  . Healthy Brother     2    ROS:  Pertinent items are noted in HPI.  Otherwise, a comprehensive ROS was negative.  Exam:   BP 112/62 mmHg  Pulse 70  Resp 16  Ht 5' 10.5" (1.791 m)  Wt 193 lb (87.544 kg)  BMI 27.29 kg/m2  LMP 10/04/2015 Height: 5' 10.5" (179.1 cm) Ht Readings from Last 3 Encounters:  11/05/15 5' 10.5" (1.791 m)  06/04/15 5\' 10"  (1.778 m)  11/01/14 5' 10.5" (1.791 m)    General appearance: alert, cooperative and appears stated age Head: Normocephalic, without obvious abnormality, atraumatic Neck: no adenopathy, supple, symmetrical, trachea midline and thyroid normal to inspection and palpation Lungs: clear to auscultation bilaterally Breasts: normal appearance, no masses or tenderness, No nipple retraction or dimpling, No nipple discharge or bleeding, No axillary or supraclavicular adenopathy Heart: regular rate and rhythm Abdomen: soft, non-tender; no masses,  no organomegaly Extremities: extremities normal, atraumatic, no cyanosis or edema Skin: Skin color, texture, turgor normal. No rashes or lesions Lymph nodes: Cervical, supraclavicular, and axillary nodes normal. No abnormal inguinal nodes palpated Neurologic: Grossly normal  Pelvic: External genitalia:  no lesions              Urethra:  normal appearing urethra with no masses, tenderness or lesions              Bartholin's and Skene's: normal                 Vagina: normal appearing vagina with normal color and discharge, no lesions              Cervix: normal appearance, no lesions nullparous os              Pap taken: Yes.   Bimanual Exam:  Uterus:  normal size, contour, position, consistency, mobility, non-tender              Adnexa: normal adnexa and no mass, fullness, tenderness               Rectovaginal: Confirms               Anus:  normal  sphincter tone, no lesions   Chaperone present: yes  A:  Well Woman with normal exam  Postmenopausal bleeding ?  Screening labs  P:   Reviewed health and wellness pertinent to exam  Discussed with patient need to evaluate due to menopausal finding with [facility] and amenorrhea for over one year. Will need PUS and endometrial biopsy to evaluate. Patient agreeable. Patient will be called with insurance information and scheduled . Questions addressed.Patient aware to call if vaginal bleeding again  Labs: CMP,Lipid panel,TSH,Vitamin D  Pap smear as above with HPVHR   counseled on breast self exam, mammography screening, adequate intake of calcium and vitamin D, diet and exercise  return annually or prn  An After Visit Summary was printed and given to the patient.

## 2015-11-06 ENCOUNTER — Telehealth: Payer: Self-pay

## 2015-11-06 ENCOUNTER — Telehealth: Payer: Self-pay | Admitting: Certified Nurse Midwife

## 2015-11-06 ENCOUNTER — Other Ambulatory Visit: Payer: Self-pay

## 2015-11-06 DIAGNOSIS — N950 Postmenopausal bleeding: Secondary | ICD-10-CM

## 2015-11-06 LAB — HEMOGLOBIN A1C
Hgb A1c MFr Bld: 5.3 % (ref <5.7)
Mean Plasma Glucose: 105 mg/dL

## 2015-11-06 LAB — TSH: TSH: 2.17 mIU/L

## 2015-11-06 LAB — VITAMIN D 25 HYDROXY (VIT D DEFICIENCY, FRACTURES): Vit D, 25-Hydroxy: 24 ng/mL — ABNORMAL LOW (ref 30–100)

## 2015-11-06 LAB — FOLLICLE STIMULATING HORMONE: FSH: 78.5 m[IU]/mL

## 2015-11-06 NOTE — Telephone Encounter (Signed)
Spoke with pt regarding benefit for ultrasound & endometrial biopsy. Patient understood and agreeable. Patient ready to schedule. Patient scheduled 11/13/15 with Dr Sabra Heck. Pt aware of arrival date and time. Pt aware of 72 hours cancellation policy with [id number] fee. No further questions. Ok to close

## 2015-11-06 NOTE — Telephone Encounter (Signed)
-----   Message from Regina Eck, CNM sent at 11/06/2015  1:02 PM EDT ----- Notify patient that Vitamin D is low again needs Protocol TSH is normal Liver, kidney profile normal, Glucose and triglycerides are elevated but not fasting and Hgb A1-C is normal Lipid profile shows borderline elevation of cholesterol and elevated triglycerides, but not fasting, HDL slightly low at 39 and needs to be greater than 46 to be helpful Elevated VLDL She needs to work on reduce fat and sugar intake in diet and increase protein and increased amount of fresh or steamed vegetables to improve profile. Also daily exercise for at least 30 minutes. Will need recheck in 6 months. Has appointment with new PCP at that time, she may want to follow up there, but will need copy of labs and will need to let us know,if not following here. [place] approximately the same with menopausal level. Order placed for PUS and endometrial biopsy.

## 2015-11-06 NOTE — Telephone Encounter (Signed)
Lmtcb.

## 2015-11-06 NOTE — Telephone Encounter (Signed)
Pt notified in result note.  Closing encounter. 

## 2015-11-07 LAB — IPS PAP TEST WITH HPV

## 2015-11-11 NOTE — Progress Notes (Signed)
Encounter reviewed Verline Kong, MD   

## 2015-11-13 ENCOUNTER — Ambulatory Visit (INDEPENDENT_AMBULATORY_CARE_PROVIDER_SITE_OTHER): Payer: Federal, State, Local not specified - PPO

## 2015-11-13 ENCOUNTER — Ambulatory Visit (INDEPENDENT_AMBULATORY_CARE_PROVIDER_SITE_OTHER): Payer: Federal, State, Local not specified - PPO | Admitting: Obstetrics & Gynecology

## 2015-11-13 VITALS — BP 124/80 | HR 82 | Resp 16 | Ht 70.5 in | Wt 192.0 lb

## 2015-11-13 DIAGNOSIS — N950 Postmenopausal bleeding: Secondary | ICD-10-CM

## 2015-11-13 NOTE — Progress Notes (Signed)
50 y.o. G0P0000 Single Caucasian female here for pelvic ultrasound due to episode of PMP bleeding in January and March.  Pt reports the bleeding in January was very light and just spotting for not even a day.  The "bleeding" in March was accompanied with vaginal discharge and was ultimately yeast. She was treated for this and it resolved.  She is not on HRT.  Pt has not had any bleeding 12/15.  She did have an [place] of 77 in 4/16 as well.  Denies any vaginal bleeding now, discharge, pelvic pain, change in urinary habits.  Contraception: abstinence   Findings:  UTERUS: 6.6 x 4.0 x 3.1cm EMS:2.65mm ADNEXA: Left ovary: 2.1 x 0.8 x 0.9cm       Right ovary: 2.4 x 0.9 x 0.9cm with simple appearing cystic structure 1.2cm lateral to ovary consistent with a paratubal cyst CUL DE SAC: no free fluid  Discussion:  Findings reviewed with pt.  With this and symmetric endometrium, feel it is ok to monitor this pt.  She is aware, if there is any further bleeding this year, an endometrial biopsy will be recommended.  It does seem that there was really only one episode of very light spotting in January so I do not think being conservative will add any additional risks for this pt.  Pt comfortable with plan (actually quite happy to not have to do a biopsy).  All qeustions answered.   Assessment:  PMP bleeding Normal PUS today with thin and symmetric endometrium No HRT Probable 1.2cm paratubal cyst  Plan:  Pt knows to call with any bleeding.  For now, will follow conservatively and not proceed with biopsy.  ~15 minutes spent with patient >50% of time was in face to face discussion of above.

## 2015-11-14 ENCOUNTER — Encounter: Payer: Self-pay | Admitting: Obstetrics & Gynecology

## 2016-03-04 NOTE — Progress Notes (Signed)
Pre visit review using our clinic review tool, if applicable. No additional management support is needed unless otherwise documented below in the visit note.  Chief Complaint  Patient presents with  . Annual Exam    HPI: Patient  Olivia Hill  50 y.o. comes in today for Preventive Health Care visit  [place]  No cahgen in health has seen gyne had lab in April   Vit d 25  A 1c 5,3 elevated tg 333  Health Maintenance  Topic Date Due  . INFLUENZA VACCINE  02/24/2016  . HIV Screening  03/05/2017 (Originally 06/22/1981)  . PAP SMEAR  11/05/2018  . TETANUS/TDAP  07/26/2021   Health Maintenance Review LIFESTYLE:  Exercise:  no Tobacco/ETS: no Alcohol: no Sugar beverages:  I Like thoise " latees strabucks  And frappe . Hot tea honey  Sleep:   About 6 hours   ... Wakens to void   Drug use: no   hh of 1  2 cats  Allergies  Saline spray .    ROS:  GEN/ HEENT: No fever, significant weight changes sweats headaches vision problems hearing changes, CV/ PULM; No chest pain shortness of breath cough, syncope,edema  change in exercise tolerance. GI /GU: No adominal pain, vomiting, change in bowel habits. No blood in the stool. No significant GU symptoms. SKIN/HEME: ,no acute skin rashes suspicious lesions or bleeding. No lymphadenopathy, nodules, masses.  NEURO/ PSYCH:  No neurologic signs such as weakness numbness. No depression anxiety. IMM/ Allergy: No unusual infections.  Allergy .   REST of 12 system review negative except as per HPI   Past Medical History:  Diagnosis Date  . Environmental and seasonal allergies   . Hx of varicella   . Hypercholesterolemia 12/13   on medication  . Recurrent sinusitis    neg ct 2013   . Skin cancer of face    cryotherapy done fall 2015    Past Surgical History:  Procedure Laterality Date  . SHOULDER SURGERY Left 03/26/2008   torn labrum 3 pins  . URETHRA SURGERY  07/26/1970    Family History  Problem  Relation Age of Onset  . Hypertension Father   . Gout Father   . Heart disease Mother   . Stroke Mother     in her 63s   . Migraines Mother   . Healthy Father     age 60  . Healthy Brother     2    Social History   Social History  . Marital status: Single    Spouse name: N/A  . Number of children: N/A  . Years of education: N/A   Social History Main Topics  . Smoking status: Never Smoker  . Smokeless tobacco: Never Used  . Alcohol use No  . Drug use: No  . Sexual activity: No   Other Topics Concern  . 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 fluticasone (FLONASE) 50 MCG/ACT nasal spray Place into both nostrils as needed for allergies or rhinitis.     No facility-administered medications prior to visit.  EXAM:  BP 120/70 (BP Location: Right Arm, Patient Position: Sitting, Cuff Size: Large)   Temp 98.1 F (36.7 C) (Oral)   Ht 5' 10.5" (1.791 m)   Wt 197 lb 3.2 oz (89.4 kg)   BMI 27.90 kg/m   Body mass index is 27.9 kg/m.  Physical Exam: Vital signs reviewed LAG:TXMI is a well-developed well-nourished alert cooperative    who appearsr stated age in no acute distress.  HEENT: normocephalic atraumatic , Eyes: PERRL EOM's full, conjunctiva clear, Nares: paten,t no deformity discharge or tenderness., Ears: no deformity EAC's clear TMs with normal landmarks. Mouth: clear OP, no lesions, edema.  Moist mucous membranes. Dentition in adequate repair. NECK: supple without masses, thyromegaly or bruits. CHEST/PULM:  Clear to auscultation and percussion breath sounds equal no wheeze , rales or rhonchi. No chest wall deformities or tenderness. CV: PMI is nondisplaced, S1 S2 no gallops, murmurs, rubs.  Peripheral pulses are full without delay.No JVD .  ABDOMEN: Bowel sounds normal nontender  No guard or rebound, no hepato splenomegal no CVA tenderness.  No hernia. Extremtities:  No clubbing cyanosis or edema, no acute joint swelling or redness no focal atrophy NEURO:  Oriented x3, cranial nerves 3-12 appear to be intact, no obvious focal weakness,gait within normal limits no abnormal reflexes or asymmetrical SKIN: No acute rashes normal turgor, color, no bruising or petechiae. PSYCH: Oriented, good eye contact, no obvious depression anxiety, cognition and judgment appear normal. LN: no cervical axillary inguinal adenopathy  Lab Results  Component Value Date   HGB 13.8 11/01/2014   GLUCOSE 102 (H) 11/05/2015   CHOL 212 (H) 11/05/2015   TRIG 330 (H) 11/05/2015   HDL 39 (L) 11/05/2015   LDLCALC 107 11/05/2015   ALT 22 11/05/2015   AST 18 11/05/2015   NA 142 11/05/2015   K 4.0 11/05/2015   CL 104 11/05/2015   CREATININE 0.79 11/05/2015   BUN 22 11/05/2015   CO2 30 11/05/2015   TSH 2.17 11/05/2015   HGBA1C 5.3 11/05/2015    ASSESSMENT AND PLAN:  Discussed the following assessment and plan:  Visit for preventive health examination  Elevated triglycerides with high cholesterol - Plan: Lipid panel, Hemoglobin A1c, Glucose, Random  Hyperglycemia - Plan: Lipid panel, Hemoglobin A1c, Glucose, Random  Hypercholesterolemia - Plan: Lipid panel, Hemoglobin A1c, Glucose, Random dysmetabolic profile  At risk for dm etc   Disc lsi  And patient aware     Can call of fasting lab  Orders placed in 4-6 month lsi  And of course yearly .  Declines  Nutrition referral at this time cause she is aware of changes she can make counsled  Patient Care Team: Burnis Medin, MD as PCP - General (Internal Medicine) Gean Quint, MD as Consulting Physician (Allergy and Immunology) Regina Eck, CNM as Referring Physician (Certified Nurse Midwife) Danella Sensing, MD as Consulting Physician  (Dermatology) sandra fuller dds  Patient Instructions    Triglyceride.  have been high . Attend to diet and exercise  Mediterranean diet is the healthiest. But limit processed carbs and animal  Fats   Can call for" fasting lab " appt only in 4-6 months to recheck sugar and  Cholesterol tg panel .and look for improvement .  [facility]  Wellness check in a year   Lab Results  Component Value Date   HGB 13.8 11/01/2014   GLUCOSE 102 (H) 11/05/2015   CHOL 212 (H) 11/05/2015   TRIG 330 (H) 11/05/2015   HDL 39 (L)  11/05/2015   LDLCALC 107 11/05/2015   ALT 22 11/05/2015   AST 18 11/05/2015   NA 142 11/05/2015   K 4.0 11/05/2015   CL 104 11/05/2015   CREATININE 0.79 11/05/2015   BUN 22 11/05/2015   CO2 30 11/05/2015   TSH 2.17 11/05/2015   HGBA1C 5.3 11/05/2015         Health Maintenance, Female Adopting a healthy lifestyle and getting preventive care can go a long way to promote health and wellness. Talk with your health care provider about what schedule of regular examinations is right for you. This is a good chance for you to check in with your provider about disease prevention and staying healthy. In between checkups, there are plenty of things you can do on your own. Experts have done a lot of research about which lifestyle changes and preventive measures are most likely to keep you healthy. Ask your health care provider for more information. WEIGHT AND DIET  Eat a healthy diet  Be sure to include plenty of vegetables, fruits, low-fat dairy products, and lean protein.  Do not eat a lot of foods high in solid fats, added sugars, or salt.  Get regular exercise. This is one of the most important things you can do for your health.  Most adults should exercise for at least 150 minutes each week. The exercise should increase your heart rate and make you sweat (moderate-intensity exercise).  Most adults should also do strengthening exercises at least twice a week. This is in  addition to the moderate-intensity exercise.  Maintain a healthy weight  Body mass index (BMI) is a measurement that can be used to identify possible weight problems. It estimates body fat based on height and weight. Your health care provider can help determine your BMI and help you achieve or maintain a healthy weight.  For females 59 years of age and older:   A BMI below 18.5 is considered underweight.  A BMI of 18.5 to 24.9 is normal.  A BMI of 25 to 29.9 is considered overweight.  A BMI of 30 and above is considered obese.  Watch levels of cholesterol and blood lipids  You should start having your blood tested for lipids and cholesterol at 50 years of age, then have this test every 5 years.  You may need to have your cholesterol levels checked more often if:  Your lipid or cholesterol levels are high.  You are older than 51 years of age.  You are at high risk for heart disease.  CANCER SCREENING   Lung Cancer  Lung cancer screening is recommended for adults 81-36 years old who are at high risk for lung cancer because of a history of smoking.  A yearly low-dose CT scan of the lungs is recommended for people who:  Currently smoke.  Have quit within the past 15 years.  Have at least a 30-pack-year history of smoking. A pack year is smoking an average of one pack of cigarettes a day for 1 year.  Yearly screening should continue until it has been 15 years since you quit.  Yearly screening should stop if you develop a health problem that would prevent you from having lung cancer treatment.  Breast Cancer  Practice breast self-awareness. This means understanding how your breasts normally appear and feel.  It also means doing regular breast self-exams. Let your health care provider know about any changes, no matter how small.  If you are in your 20s or 30s, you should  have a clinical breast exam (CBE) by a health care provider every 1-3 years as part of a regular  health exam.  If you are 87 or older, have a CBE every year. Also consider having a breast X-ray (mammogram) every year.  If you have a family history of breast cancer, talk to your health care provider about genetic screening.  If you are at high risk for breast cancer, talk to your health care provider about having an MRI and a mammogram every year.  Breast cancer gene (BRCA) assessment is recommended for women who have family members with BRCA-related cancers. BRCA-related cancers include:  Breast.  Ovarian.  Tubal.  Peritoneal cancers.  Results of the assessment will determine the need for genetic counseling and BRCA1 and BRCA2 testing. Cervical Cancer Your health care provider may recommend that you be screened regularly for cancer of the pelvic organs (ovaries, uterus, and vagina). This screening involves a pelvic examination, including checking for microscopic changes to the surface of your cervix (Pap test). You may be encouraged to have this screening done every 3 years, beginning at age 27.  For women ages 68-65, health care providers may recommend pelvic exams and Pap testing every 3 years, or they may recommend the Pap and pelvic exam, combined with testing for human papilloma virus (HPV), every 5 years. Some types of HPV increase your risk of cervical cancer. Testing for HPV may also be done on women of any age with unclear Pap test results.  Other health care providers may not recommend any screening for nonpregnant women who are considered low risk for pelvic cancer and who do not have symptoms. Ask your health care provider if a screening pelvic exam is right for you.  If you have had past treatment for cervical cancer or a condition that could lead to cancer, you need Pap tests and screening for cancer for at least 20 years after your treatment. If Pap tests have been discontinued, your risk factors (such as having a new sexual partner) need to be reassessed to determine if  screening should resume. Some women have medical problems that increase the chance of getting cervical cancer. In these cases, your health care provider may recommend more frequent screening and Pap tests. Colorectal Cancer  This type of cancer can be detected and often prevented.  Routine colorectal cancer screening usually begins at 50 years of age and continues through 50 years of age.  Your health care provider may recommend screening at an earlier age if you have risk factors for colon cancer.  Your health care provider may also recommend using home test kits to check for hidden blood in the stool.  A small camera at the end of a tube can be used to examine your colon directly (sigmoidoscopy or colonoscopy). This is done to check for the earliest forms of colorectal cancer.  Routine screening usually begins at age 75.  Direct examination of the colon should be repeated every 5-10 years through 50 years of age. However, you may need to be screened more often if early forms of precancerous polyps or small growths are found. Skin Cancer  Check your skin from head to toe regularly.  Tell your health care provider about any new moles or changes in moles, especially if there is a change in a mole's shape or color.  Also tell your health care provider if you have a mole that is larger than the size of a pencil eraser.  Always use sunscreen. Apply  sunscreen liberally and repeatedly throughout the day.  Protect yourself by wearing long sleeves, pants, a wide-brimmed hat, and sunglasses whenever you are outside. HEART DISEASE, DIABETES, AND HIGH BLOOD PRESSURE   High blood pressure causes heart disease and increases the risk of stroke. High blood pressure is more likely to develop in:  People who have blood pressure in the high end of the normal range (130-139/85-89 mm Hg).  People who are overweight or obese.  People who are African American.  If you are 57-21 years of age, have your  blood pressure checked every 3-5 years. If you are 34 years of age or older, have your blood pressure checked every year. You should have your blood pressure measured twice--once when you are at a hospital or clinic, and once when you are not at a hospital or clinic. Record the average of the two measurements. To check your blood pressure when you are not at a hospital or clinic, you can use:  An automated blood pressure machine at a pharmacy.  A home blood pressure monitor.  If you are between 60 years and 47 years old, ask your health care provider if you should take aspirin to prevent strokes.  Have regular diabetes screenings. This involves taking a blood sample to check your fasting blood sugar level.  If you are at a normal weight and have a low risk for diabetes, have this test once every three years after 50 years of age.  If you are overweight and have a high risk for diabetes, consider being tested at a younger age or more often. PREVENTING INFECTION  Hepatitis B  If you have a higher risk for hepatitis B, you should be screened for this virus. You are considered at high risk for hepatitis B if:  You were born in a country where hepatitis B is common. Ask your health care provider which countries are considered high risk.  Your parents were born in a high-risk country, and you have not been immunized against hepatitis B (hepatitis B vaccine).  You have HIV or AIDS.  You use needles to inject street drugs.  You live with someone who has hepatitis B.  You have had sex with someone who has hepatitis B.  You get hemodialysis treatment.  You take certain medicines for conditions, including cancer, organ transplantation, and autoimmune conditions. Hepatitis C  Blood testing is recommended for:  Everyone born from [birth year] through 1965.  Anyone with known risk factors for hepatitis C. Sexually transmitted infections (STIs)  You should be screened for sexually transmitted  infections (STIs) including gonorrhea and chlamydia if:  You are sexually active and are younger than 50 years of age.  You are older than 50 years of age and your health care provider tells you that you are at risk for this type of infection.  Your sexual activity has changed since you were last screened and you are at an increased risk for chlamydia or gonorrhea. Ask your health care provider if you are at risk.  If you do not have HIV, but are at risk, it may be recommended that you take a prescription medicine daily to prevent HIV infection. This is called pre-exposure prophylaxis (PrEP). You are considered at risk if:  You are sexually active and do not regularly use condoms or know the HIV status of your partner(s).  You take drugs by injection.  You are sexually active with a partner who has HIV. Talk with your health care provider about whether  you are at high risk of being infected with HIV. If you choose to begin PrEP, you should first be tested for HIV. You should then be tested every 3 months for as long as you are taking PrEP.  PREGNANCY   If you are premenopausal and you may become pregnant, ask your health care provider about preconception counseling.  If you may become pregnant, take 400 to 800 micrograms (mcg) of folic acid every day.  If you want to prevent pregnancy, talk to your health care provider about birth control (contraception). OSTEOPOROSIS AND MENOPAUSE   Osteoporosis is a disease in which the bones lose minerals and strength with aging. This can result in serious bone fractures. Your risk for osteoporosis can be identified using a bone density scan.  If you are 51 years of age or older, or if you are at risk for osteoporosis and fractures, ask your health care provider if you should be screened.  Ask your health care provider whether you should take a calcium or vitamin D supplement to lower your risk for osteoporosis.  Menopause may have certain physical  symptoms and risks.  Hormone replacement therapy may reduce some of these symptoms and risks. Talk to your health care provider about whether hormone replacement therapy is right for you.  HOME CARE INSTRUCTIONS   Schedule regular health, dental, and eye exams.  Stay current with your immunizations.   Do not use any tobacco products including cigarettes, chewing tobacco, or electronic cigarettes.  If you are pregnant, do not drink alcohol.  If you are breastfeeding, limit how much and how often you drink alcohol.  Limit alcohol intake to no more than 1 drink per day for nonpregnant women. One drink equals 12 ounces of beer, 5 ounces of wine, or 1 ounces of hard liquor.  Do not use street drugs.  Do not share needles.  Ask your health care provider for help if you need support or information about quitting drugs.  Tell your health care provider if you often feel depressed.  Tell your health care provider if you have ever been abused or do not feel safe at home.   This information is not intended to replace advice given to you by your health care provider. Make sure you discuss any questions you have with your health care provider.   Document Released: 01/25/2011 Document Revised: 08/02/2014 Document Reviewed: 06/13/2013 Elsevier Interactive Patient Education [employer].      Why follow it? Research shows. . Those who follow the Mediterranean diet have a reduced risk of heart disease  . The diet is associated with a reduced incidence of Parkinson's and Alzheimer's diseases . People following the diet may have longer life expectancies and lower rates of chronic diseases  . The Dietary Guidelines for Americans recommends the Mediterranean diet as an eating plan to promote health and prevent disease  What Is the Mediterranean Diet?  . Healthy eating plan based on typical foods and recipes of Mediterranean-style cooking . The diet is primarily a plant based diet; these  foods should make up a majority of meals   Starches - Plant based foods should make up a majority of meals - They are an important sources of vitamins, minerals, energy, antioxidants, and fiber - Choose whole grains, foods high in fiber and minimally processed items  - Typical grain sources include wheat, oats, barley, corn, brown rice, bulgar, farro, millet, polenta, couscous  - Various types of beans include chickpeas, lentils, fava beans, black  beans, white beans   Fruits  Veggies - Large quantities of antioxidant rich fruits & veggies; 6 or more servings  - Vegetables can be eaten raw or lightly drizzled with oil and cooked  - Vegetables common to the traditional Mediterranean Diet include: artichokes, arugula, beets, broccoli, brussel sprouts, cabbage, carrots, celery, collard greens, cucumbers, eggplant, kale, leeks, lemons, lettuce, mushrooms, okra, onions, peas, peppers, potatoes, pumpkin, radishes, rutabaga, shallots, spinach, sweet potatoes, turnips, zucchini - Fruits common to the Mediterranean Diet include: apples, apricots, avocados, cherries, clementines, dates, figs, grapefruits, grapes, melons, nectarines, oranges, peaches, pears, pomegranates, strawberries, tangerines  Fats - Replace butter and margarine with healthy oils, such as olive oil, canola oil, and tahini  - Limit nuts to no more than a handful a day  - Nuts include walnuts, almonds, pecans, pistachios, pine nuts  - Limit or avoid candied, honey roasted or heavily salted nuts - Olives are central to the [employer] - can be eaten whole or used in a variety of dishes   Meats Protein - Limiting red meat: no more than a few times a month - When eating red meat: choose lean cuts and keep the portion to the size of deck of cards - Eggs: approx. 0 to 4 times a week  - Fish and lean poultry: at least 2 a week  - Healthy protein sources include, chicken, [place], lean beef, lamb - Increase intake of seafood such as tuna,  salmon, trout, mackerel, shrimp, scallops - Avoid or limit high fat processed meats such as sausage and bacon  Dairy - Include moderate amounts of low fat dairy products  - Focus on healthy dairy such as fat free yogurt, skim milk, low or reduced fat cheese - Limit dairy products higher in fat such as whole or 2% milk, cheese, ice cream  Alcohol - Moderate amounts of red wine is ok  - No more than 5 oz daily for women (all ages) and men older than age 73  - No more than 10 oz of wine daily for men younger than 46  Other - Limit sweets and other desserts  - Use herbs and spices instead of salt to flavor foods  - Herbs and spices common to the traditional Mediterranean Diet include: basil, bay leaves, chives, cloves, cumin, fennel, garlic, lavender, marjoram, mint, oregano, parsley, pepper, rosemary, sage, savory, sumac, tarragon, thyme   It's not just a diet, it's a lifestyle:  . The Mediterranean diet includes lifestyle factors typical of those in the region  . Foods, drinks and meals are best eaten with others and savored . Daily physical activity is important for overall good health . This could be strenuous exercise like running and aerobics . This could also be more leisurely activities such as walking, housework, yard-work, or taking the stairs . Moderation is the key; a balanced and healthy diet accommodates most foods and drinks . Consider portion sizes and frequency of consumption of certain foods   Meal Ideas & Options:  . Breakfast:  o Whole wheat toast or whole wheat English muffins with peanut butter & hard boiled egg o Steel cut oats topped with apples & cinnamon and skim milk  o Fresh fruit: banana, strawberries, melon, berries, peaches  o Smoothies: strawberries, bananas, greek yogurt, peanut butter o Low fat greek yogurt with blueberries and granola  o Egg white omelet with spinach and mushrooms o Breakfast couscous: whole wheat couscous, apricots, skim milk, cranberries    . Sandwiches:  o Hummus and  grilled vegetables (peppers, zucchini, squash) on whole wheat bread   o Grilled chicken on whole wheat pita with lettuce, tomatoes, cucumbers or tzatziki  o Tuna salad on whole wheat bread: tuna salad made with greek yogurt, olives, red peppers, capers, green onions o Garlic rosemary lamb pita: lamb sauted with garlic, rosemary, salt & pepper; add lettuce, cucumber, greek yogurt to pita - flavor with lemon juice and black pepper  . Seafood:  o Mediterranean grilled salmon, seasoned with garlic, basil, parsley, lemon juice and black pepper o Shrimp, lemon, and spinach whole-grain pasta salad made with low fat greek yogurt  o Seared scallops with lemon orzo  o Seared tuna steaks seasoned salt, pepper, coriander topped with tomato mixture of olives, tomatoes, olive oil, minced garlic, parsley, green onions and cappers  . Meats:  o Herbed greek chicken salad with kalamata olives, cucumber, feta  o Red bell peppers stuffed with spinach, bulgur, lean ground beef (or lentils) & topped with feta   o Kebabs: skewers of chicken, tomatoes, onions, zucchini, squash  o [place] burgers: made with red onions, mint, dill, lemon juice, feta cheese topped with roasted red peppers . Vegetarian o Cucumber salad: cucumbers, artichoke hearts, celery, red onion, feta cheese, tossed in olive oil & lemon juice  o Hummus and whole grain pita points with a greek salad (lettuce, tomato, feta, olives, cucumbers, red onion) o Lentil soup with celery, carrots made with vegetable broth, garlic, salt and pepper  o Tabouli salad: parsley, bulgur, mint, scallions, cucumbers, tomato, radishes, lemon juice, olive oil, salt and pepper.         Standley Brooking. Jatavia Keltner M.D.

## 2016-03-05 ENCOUNTER — Encounter: Payer: Self-pay | Admitting: Internal Medicine

## 2016-03-05 ENCOUNTER — Ambulatory Visit (INDEPENDENT_AMBULATORY_CARE_PROVIDER_SITE_OTHER): Payer: Federal, State, Local not specified - PPO | Admitting: Internal Medicine

## 2016-03-05 VITALS — BP 120/70 | Temp 98.1°F | Ht 70.5 in | Wt 197.2 lb

## 2016-03-05 DIAGNOSIS — E782 Mixed hyperlipidemia: Secondary | ICD-10-CM

## 2016-03-05 DIAGNOSIS — R739 Hyperglycemia, unspecified: Secondary | ICD-10-CM

## 2016-03-05 DIAGNOSIS — E7800 Pure hypercholesterolemia, unspecified: Secondary | ICD-10-CM

## 2016-03-05 DIAGNOSIS — Z0000 Encounter for general adult medical examination without abnormal findings: Secondary | ICD-10-CM

## 2016-03-05 NOTE — Patient Instructions (Addendum)
Triglyceride.  have been high . Attend to diet and exercise  Mediterranean diet is the healthiest. But limit processed carbs and animal  Fats   Can call for" fasting lab " appt only in 4-6 months to recheck sugar and  Cholesterol tg panel .and look for improvement .  [facility]  Wellness check in a year   Lab Results  Component Value Date   HGB 13.8 11/01/2014   GLUCOSE 102 (H) 11/05/2015   CHOL 212 (H) 11/05/2015   TRIG 330 (H) 11/05/2015   HDL 39 (L) 11/05/2015   LDLCALC 107 11/05/2015   ALT 22 11/05/2015   AST 18 11/05/2015   NA 142 11/05/2015   K 4.0 11/05/2015   CL 104 11/05/2015   CREATININE 0.79 11/05/2015   BUN 22 11/05/2015   CO2 30 11/05/2015   TSH 2.17 11/05/2015   HGBA1C 5.3 11/05/2015         Health Maintenance, Female Adopting a healthy lifestyle and getting preventive care can go a long way to promote health and wellness. Talk with your health care provider about what schedule of regular examinations is right for you. This is a good chance for you to check in with your provider about disease prevention and staying healthy. In between checkups, there are plenty of things you can do on your own. Experts have done a lot of research about which lifestyle changes and preventive measures are most likely to keep you healthy. Ask your health care provider for more information. WEIGHT AND DIET  Eat a healthy diet  Be sure to include plenty of vegetables, fruits, low-fat dairy products, and lean protein.  Do not eat a lot of foods high in solid fats, added sugars, or salt.  Get regular exercise. This is one of the most important things you can do for your health.  Most adults should exercise for at least 150 minutes each week. The exercise should increase your heart rate and make you sweat (moderate-intensity exercise).  Most adults should also do strengthening exercises at least twice a week. This is in addition to the moderate-intensity exercise.  Maintain a  healthy weight  Body mass index (BMI) is a measurement that can be used to identify possible weight problems. It estimates body fat based on height and weight. Your health care provider can help determine your BMI and help you achieve or maintain a healthy weight.  For females 50 years of age and older:   A BMI below 18.5 is considered underweight.  A BMI of 18.5 to 24.9 is normal.  A BMI of 25 to 29.9 is considered overweight.  A BMI of 30 and above is considered obese.  Watch levels of cholesterol and blood lipids  You should start having your blood tested for lipids and cholesterol at 50 years of age, then have this test every 5 years.  You may need to have your cholesterol levels checked more often if:  Your lipid or cholesterol levels are high.  You are older than 50 years of age.  You are at high risk for heart disease.  CANCER SCREENING   Lung Cancer  Lung cancer screening is recommended for adults 50-36 years old who are at high risk for lung cancer because of a history of smoking.  A yearly low-dose CT scan of the lungs is recommended for people who:  Currently smoke.  Have quit within the past 15 years.  Have at least a 30-pack-year history of smoking. A pack year is smoking  an average of one pack of cigarettes a day for 1 year.  Yearly screening should continue until it has been 15 years since you quit.  Yearly screening should stop if you develop a health problem that would prevent you from having lung cancer treatment.  Breast Cancer  Practice breast self-awareness. This means understanding how your breasts normally appear and feel.  It also means doing regular breast self-exams. Let your health care provider know about any changes, no matter how small.  If you are in your 50s or 30s, you should have a clinical breast exam (CBE) by a health care provider every 1-3 years as part of a regular health exam.  If you are 41 or older, have a CBE every year.  Also consider having a breast X-ray (mammogram) every year.  If you have a family history of breast cancer, talk to your health care provider about genetic screening.  If you are at high risk for breast cancer, talk to your health care provider about having an MRI and a mammogram every year.  Breast cancer gene (BRCA) assessment is recommended for women who have family members with BRCA-related cancers. BRCA-related cancers include:  Breast.  Ovarian.  Tubal.  Peritoneal cancers.  Results of the assessment will determine the need for genetic counseling and BRCA1 and BRCA2 testing. Cervical Cancer Your health care provider may recommend that you be screened regularly for cancer of the pelvic organs (ovaries, uterus, and vagina). This screening involves a pelvic examination, including checking for microscopic changes to the surface of your cervix (Pap test). You may be encouraged to have this screening done every 3 years, beginning at age 50.  For women ages 50-65, health care providers may recommend pelvic exams and Pap testing every 3 years, or they may recommend the Pap and pelvic exam, combined with testing for human papilloma virus (HPV), every 5 years. Some types of HPV increase your risk of cervical cancer. Testing for HPV may also be done on women of any age with unclear Pap test results.  Other health care providers may not recommend any screening for nonpregnant women who are considered low risk for pelvic cancer and who do not have symptoms. Ask your health care provider if a screening pelvic exam is right for you.  If you have had past treatment for cervical cancer or a condition that could lead to cancer, you need Pap tests and screening for cancer for at least 20 years after your treatment. If Pap tests have been discontinued, your risk factors (such as having a new sexual partner) need to be reassessed to determine if screening should resume. Some women have medical problems that  increase the chance of getting cervical cancer. In these cases, your health care provider may recommend more frequent screening and Pap tests. Colorectal Cancer  This type of cancer can be detected and often prevented.  Routine colorectal cancer screening usually begins at 50 years of age and continues through 50 years of age.  Your health care provider may recommend screening at an earlier age if you have risk factors for colon cancer.  Your health care provider may also recommend using home test kits to check for hidden blood in the stool.  A small camera at the end of a tube can be used to examine your colon directly (sigmoidoscopy or colonoscopy). This is done to check for the earliest forms of colorectal cancer.  Routine screening usually begins at age 3.  Direct examination of the colon  should be repeated every 5-10 years through 50 years of age. However, you may need to be screened more often if early forms of precancerous polyps or small growths are found. Skin Cancer  Check your skin from head to toe regularly.  Tell your health care provider about any new moles or changes in moles, especially if there is a change in a mole's shape or color.  Also tell your health care provider if you have a mole that is larger than the size of a pencil eraser.  Always use sunscreen. Apply sunscreen liberally and repeatedly throughout the day.  Protect yourself by wearing long sleeves, pants, a wide-brimmed hat, and sunglasses whenever you are outside. HEART DISEASE, DIABETES, AND HIGH BLOOD PRESSURE   High blood pressure causes heart disease and increases the risk of stroke. High blood pressure is more likely to develop in:  People who have blood pressure in the high end of the normal range (130-139/85-89 mm Hg).  People who are overweight or obese.  People who are African American.  If you are 3-60 years of age, have your blood pressure checked every 3-5 years. If you are 25 years of  age or older, have your blood pressure checked every year. You should have your blood pressure measured twice--once when you are at a hospital or clinic, and once when you are not at a hospital or clinic. Record the average of the two measurements. To check your blood pressure when you are not at a hospital or clinic, you can use:  An automated blood pressure machine at a pharmacy.  A home blood pressure monitor.  If you are between 52 years and 4 years old, ask your health care provider if you should take aspirin to prevent strokes.  Have regular diabetes screenings. This involves taking a blood sample to check your fasting blood sugar level.  If you are at a normal weight and have a low risk for diabetes, have this test once every three years after 50 years of age.  If you are overweight and have a high risk for diabetes, consider being tested at a younger age or more often. PREVENTING INFECTION  Hepatitis B  If you have a higher risk for hepatitis B, you should be screened for this virus. You are considered at high risk for hepatitis B if:  You were born in a country where hepatitis B is common. Ask your health care provider which countries are considered high risk.  Your parents were born in a high-risk country, and you have not been immunized against hepatitis B (hepatitis B vaccine).  You have HIV or AIDS.  You use needles to inject street drugs.  You live with someone who has hepatitis B.  You have had sex with someone who has hepatitis B.  You get hemodialysis treatment.  You take certain medicines for conditions, including cancer, organ transplantation, and autoimmune conditions. Hepatitis C  Blood testing is recommended for:  Everyone born from [birth year] through 1965.  Anyone with known risk factors for hepatitis C. Sexually transmitted infections (STIs)  You should be screened for sexually transmitted infections (STIs) including gonorrhea and chlamydia if:  You are  sexually active and are younger than 50 years of age.  You are older than 50 years of age and your health care provider tells you that you are at risk for this type of infection.  Your sexual activity has changed since you were last screened and you are at an increased risk for  chlamydia or gonorrhea. Ask your health care provider if you are at risk.  If you do not have HIV, but are at risk, it may be recommended that you take a prescription medicine daily to prevent HIV infection. This is called pre-exposure prophylaxis (PrEP). You are considered at risk if:  You are sexually active and do not regularly use condoms or know the HIV status of your partner(s).  You take drugs by injection.  You are sexually active with a partner who has HIV. Talk with your health care provider about whether you are at high risk of being infected with HIV. If you choose to begin PrEP, you should first be tested for HIV. You should then be tested every 3 months for as long as you are taking PrEP.  PREGNANCY   If you are premenopausal and you may become pregnant, ask your health care provider about preconception counseling.  If you may become pregnant, take 400 to 800 micrograms (mcg) of folic acid every day.  If you want to prevent pregnancy, talk to your health care provider about birth control (contraception). OSTEOPOROSIS AND MENOPAUSE   Osteoporosis is a disease in which the bones lose minerals and strength with aging. This can result in serious bone fractures. Your risk for osteoporosis can be identified using a bone density scan.  If you are 69 years of age or older, or if you are at risk for osteoporosis and fractures, ask your health care provider if you should be screened.  Ask your health care provider whether you should take a calcium or vitamin D supplement to lower your risk for osteoporosis.  Menopause may have certain physical symptoms and risks.  Hormone replacement therapy may reduce some  of these symptoms and risks. Talk to your health care provider about whether hormone replacement therapy is right for you.  HOME CARE INSTRUCTIONS   Schedule regular health, dental, and eye exams.  Stay current with your immunizations.   Do not use any tobacco products including cigarettes, chewing tobacco, or electronic cigarettes.  If you are pregnant, do not drink alcohol.  If you are breastfeeding, limit how much and how often you drink alcohol.  Limit alcohol intake to no more than 1 drink per day for nonpregnant women. One drink equals 12 ounces of beer, 5 ounces of wine, or 1 ounces of hard liquor.  Do not use street drugs.  Do not share needles.  Ask your health care provider for help if you need support or information about quitting drugs.  Tell your health care provider if you often feel depressed.  Tell your health care provider if you have ever been abused or do not feel safe at home.   This information is not intended to replace advice given to you by your health care provider. Make sure you discuss any questions you have with your health care provider.   Document Released: 01/25/2011 Document Revised: 08/02/2014 Document Reviewed: 06/13/2013 Elsevier Interactive Patient Education [employer].      Why follow it? Research shows. . Those who follow the Mediterranean diet have a reduced risk of heart disease  . The diet is associated with a reduced incidence of Parkinson's and Alzheimer's diseases . People following the diet may have longer life expectancies and lower rates of chronic diseases  . The Dietary Guidelines for Americans recommends the Mediterranean diet as an eating plan to promote health and prevent disease  What Is the Mediterranean Diet?  . Healthy eating plan based  on typical foods and recipes of Mediterranean-style cooking . The diet is primarily a plant based diet; these foods should make up a majority of meals   Starches - Plant based  foods should make up a majority of meals - They are an important sources of vitamins, minerals, energy, antioxidants, and fiber - Choose whole grains, foods high in fiber and minimally processed items  - Typical grain sources include wheat, oats, barley, corn, brown rice, bulgar, farro, millet, polenta, couscous  - Various types of beans include chickpeas, lentils, fava beans, black beans, white beans   Fruits  Veggies - Large quantities of antioxidant rich fruits & veggies; 6 or more servings  - Vegetables can be eaten raw or lightly drizzled with oil and cooked  - Vegetables common to the traditional Mediterranean Diet include: artichokes, arugula, beets, broccoli, brussel sprouts, cabbage, carrots, celery, collard greens, cucumbers, eggplant, kale, leeks, lemons, lettuce, mushrooms, okra, onions, peas, peppers, potatoes, pumpkin, radishes, rutabaga, shallots, spinach, sweet potatoes, turnips, zucchini - Fruits common to the Mediterranean Diet include: apples, apricots, avocados, cherries, clementines, dates, figs, grapefruits, grapes, melons, nectarines, oranges, peaches, pears, pomegranates, strawberries, tangerines  Fats - Replace butter and margarine with healthy oils, such as olive oil, canola oil, and tahini  - Limit nuts to no more than a handful a day  - Nuts include walnuts, almonds, pecans, pistachios, pine nuts  - Limit or avoid candied, honey roasted or heavily salted nuts - Olives are central to the [employer] - can be eaten whole or used in a variety of dishes   Meats Protein - Limiting red meat: no more than a few times a month - When eating red meat: choose lean cuts and keep the portion to the size of deck of cards - Eggs: approx. 0 to 4 times a week  - Fish and lean poultry: at least 2 a week  - Healthy protein sources include, chicken, [place], lean beef, lamb - Increase intake of seafood such as tuna, salmon, trout, mackerel, shrimp, scallops - Avoid or limit high fat  processed meats such as sausage and bacon  Dairy - Include moderate amounts of low fat dairy products  - Focus on healthy dairy such as fat free yogurt, skim milk, low or reduced fat cheese - Limit dairy products higher in fat such as whole or 2% milk, cheese, ice cream  Alcohol - Moderate amounts of red wine is ok  - No more than 5 oz daily for women (all ages) and men older than age 77  - No more than 10 oz of wine daily for men younger than 58  Other - Limit sweets and other desserts  - Use herbs and spices instead of salt to flavor foods  - Herbs and spices common to the traditional Mediterranean Diet include: basil, bay leaves, chives, cloves, cumin, fennel, garlic, lavender, marjoram, mint, oregano, parsley, pepper, rosemary, sage, savory, sumac, tarragon, thyme   It's not just a diet, it's a lifestyle:  . The Mediterranean diet includes lifestyle factors typical of those in the region  . Foods, drinks and meals are best eaten with others and savored . Daily physical activity is important for overall good health . This could be strenuous exercise like running and aerobics . This could also be more leisurely activities such as walking, housework, yard-work, or taking the stairs . Moderation is the key; a balanced and healthy diet accommodates most foods and drinks . Consider portion sizes and frequency of consumption of  certain foods   Meal Ideas & Options:  . Breakfast:  o Whole wheat toast or whole wheat English muffins with peanut butter & hard boiled egg o Steel cut oats topped with apples & cinnamon and skim milk  o Fresh fruit: banana, strawberries, melon, berries, peaches  o Smoothies: strawberries, bananas, greek yogurt, peanut butter o Low fat greek yogurt with blueberries and granola  o Egg white omelet with spinach and mushrooms o Breakfast couscous: whole wheat couscous, apricots, skim milk, cranberries  . Sandwiches:  o Hummus and grilled vegetables (peppers, zucchini,  squash) on whole wheat bread   o Grilled chicken on whole wheat pita with lettuce, tomatoes, cucumbers or tzatziki  o Tuna salad on whole wheat bread: tuna salad made with greek yogurt, olives, red peppers, capers, green onions o Garlic rosemary lamb pita: lamb sauted with garlic, rosemary, salt & pepper; add lettuce, cucumber, greek yogurt to pita - flavor with lemon juice and black pepper  . Seafood:  o Mediterranean grilled salmon, seasoned with garlic, basil, parsley, lemon juice and black pepper o Shrimp, lemon, and spinach whole-grain pasta salad made with low fat greek yogurt  o Seared scallops with lemon orzo  o Seared tuna steaks seasoned salt, pepper, coriander topped with tomato mixture of olives, tomatoes, olive oil, minced garlic, parsley, green onions and cappers  . Meats:  o Herbed greek chicken salad with kalamata olives, cucumber, feta  o Red bell peppers stuffed with spinach, bulgur, lean ground beef (or lentils) & topped with feta   o Kebabs: skewers of chicken, tomatoes, onions, zucchini, squash  o [place] burgers: made with red onions, mint, dill, lemon juice, feta cheese topped with roasted red peppers . Vegetarian o Cucumber salad: cucumbers, artichoke hearts, celery, red onion, feta cheese, tossed in olive oil & lemon juice  o Hummus and whole grain pita points with a greek salad (lettuce, tomato, feta, olives, cucumbers, red onion) o Lentil soup with celery, carrots made with vegetable broth, garlic, salt and pepper  o Tabouli salad: parsley, bulgur, mint, scallions, cucumbers, tomato, radishes, lemon juice, olive oil, salt and pepper.

## 2016-05-05 ENCOUNTER — Ambulatory Visit (INDEPENDENT_AMBULATORY_CARE_PROVIDER_SITE_OTHER): Payer: Federal, State, Local not specified - PPO | Admitting: Certified Nurse Midwife

## 2016-05-05 ENCOUNTER — Encounter: Payer: Self-pay | Admitting: Certified Nurse Midwife

## 2016-05-05 VITALS — BP 102/70 | HR 72 | Resp 16 | Ht 70.5 in | Wt 191.0 lb

## 2016-05-05 DIAGNOSIS — K644 Residual hemorrhoidal skin tags: Secondary | ICD-10-CM

## 2016-05-05 MED ORDER — HYDROCORTISONE 2.5 % RE CREA: 1.0000 "application " | TOPICAL_CREAM | Freq: Two times a day (BID) | RECTAL | 1 refills | Status: DC

## 2016-05-05 NOTE — Progress Notes (Signed)
50 y.o. Single Caucasian female [id number] here with complaint of bright red blood with bowel movement only. Has changed diet with no sugar diet and eating more and fruits and roughage. Has noted increase firmness or stool. No pain with stool, finally tried Preparation H suppository two days ago and has not seen blood today. Never had hemorrhoid and would like to be checked to see if this is the problem. Denies diarrhea or abdominal pain. Has been using warm water sitz bath for comfort also. Stands and sits long periods of time with job. No other health issues today.   O:Healthy female WDWN Affect: normal, orientation x 3  Exam:Skin: warm and dry Abdomen:soft, normal bowel sounds, no tenderness or masses palpated  Inguinal Lymph nodes: no enlargement or tenderness Pelvic exam: External genital: normal female, no lesions BUS: negative Vagina: normal appearance no pelvic exam done Rectal area: small hemorhoid noted at 12 o'clock at anal opening, appears to have been thrombosed but now healing, analscope used to visualize, no fissure noted. Slightly tender, but healing. No blood noted in rectal canal with swab and digital exam.   A:Hemorrhoid previous thrombosed appearance, now resolving. No rectal bleeding noted. Diet change with firmer stools and more irregular bowel movements.   P:Discussed findings of hemmorhoid and etiology. Discussed continue of sitz bath for comfort. Increase water intake and continue to increase fiber in diet to help with firmer stools. Start on daily Colace stool softener. Instructions given for OTC use. Avoid straining with stools. Warning signs of rectal bleeding given and need to advise. Area shown to patient in mirror for clarification. Rx: Anusol HC cream bid for up to 7 days. Questions addressed.   Rv prn

## 2016-05-05 NOTE — Patient Instructions (Signed)
Hemorrhoids Hemorrhoids are puffy (swollen) veins around the rectum or anus. Hemorrhoids can cause pain, itching, bleeding, or irritation. HOME CARE  Eat foods with fiber, such as whole grains, beans, nuts, fruits, and vegetables. Ask your doctor about taking products with added fiber in them (fibersupplements).  Drink enough fluid to keep your pee (urine) clear or pale yellow.  Exercise often.  Go to the bathroom when you have the urge to poop. Do not wait.  Avoid straining to poop (bowel movement).  Keep the butt area dry and clean. Use wet toilet paper or moist paper towels.  Medicated creams and medicine inserted into the anus (anal suppository) may be used or applied as told.  Only take medicine as told by your doctor.  Take a warm water bath (sitz bath) for 15-20 minutes to ease pain. Do this 3-4 times a day.  Place ice packs on the area if it is tender or puffy. Use the ice packs between the warm water baths.  Put ice in a plastic bag.  Place a towel between your skin and the bag.  Leave the ice on for 15-20 minutes, 03-04 times a day.  Do not use a donut-shaped pillow or sit on the toilet for a long time. GET HELP RIGHT AWAY IF:   You have more pain that is not controlled by treatment or medicine.  You have bleeding that will not stop.  You have trouble or are unable to poop (bowel movement).  You have pain or puffiness outside the area of the hemorrhoids. MAKE SURE YOU:   Understand these instructions.  Will watch your condition.  Will get help right away if you are not doing well or get worse.   This information is not intended to replace advice given to you by your health care provider. Make sure you discuss any questions you have with your health care provider.   Purchase Colace stool softener   Document Released: 04/20/2008 Document Revised: 06/28/2012 Document Reviewed: 05/23/2012 Elsevier Interactive Patient Education [employer].

## 2016-05-09 NOTE — Progress Notes (Signed)
Encounter reviewed Sandy Blouch, MD   

## 2016-11-05 ENCOUNTER — Ambulatory Visit: Payer: Federal, State, Local not specified - PPO | Admitting: Certified Nurse Midwife

## 2016-11-19 ENCOUNTER — Ambulatory Visit: Payer: Federal, State, Local not specified - PPO | Admitting: Certified Nurse Midwife

## 2016-11-22 ENCOUNTER — Other Ambulatory Visit (INDEPENDENT_AMBULATORY_CARE_PROVIDER_SITE_OTHER): Payer: Federal, State, Local not specified - PPO

## 2016-11-22 DIAGNOSIS — E7800 Pure hypercholesterolemia, unspecified: Secondary | ICD-10-CM | POA: Diagnosis not present

## 2016-11-22 DIAGNOSIS — R739 Hyperglycemia, unspecified: Secondary | ICD-10-CM

## 2016-11-22 DIAGNOSIS — E782 Mixed hyperlipidemia: Secondary | ICD-10-CM | POA: Diagnosis not present

## 2016-11-22 LAB — LIPID PANEL
Cholesterol: 237 mg/dL — ABNORMAL HIGH (ref 0–200)
HDL: 44.7 mg/dL (ref >39.00)
LDL Cholesterol: 161 mg/dL — ABNORMAL HIGH (ref 0–99)
NonHDL: 192.77
Total CHOL/HDL Ratio: 5
Triglycerides: 161 mg/dL — ABNORMAL HIGH (ref 0.0–149.0)
VLDL: 32.2 mg/dL (ref 0.0–40.0)

## 2016-11-22 LAB — HEMOGLOBIN A1C: Hgb A1c MFr Bld: 5.7 % (ref 4.6–6.5)

## 2016-11-22 LAB — GLUCOSE, RANDOM: Glucose, Bld: 86 mg/dL (ref 70–99)

## 2016-11-25 ENCOUNTER — Encounter: Payer: Self-pay | Admitting: Certified Nurse Midwife

## 2017-01-21 ENCOUNTER — Encounter: Payer: Self-pay | Admitting: Certified Nurse Midwife

## 2017-01-21 ENCOUNTER — Ambulatory Visit (INDEPENDENT_AMBULATORY_CARE_PROVIDER_SITE_OTHER): Payer: Federal, State, Local not specified - PPO | Admitting: Certified Nurse Midwife

## 2017-01-21 VITALS — BP 100/60 | HR 68 | Resp 16 | Ht 70.25 in | Wt 189.0 lb

## 2017-01-21 DIAGNOSIS — N951 Menopausal and female climacteric states: Secondary | ICD-10-CM

## 2017-01-21 DIAGNOSIS — Z01419 Encounter for gynecological examination (general) (routine) without abnormal findings: Secondary | ICD-10-CM

## 2017-01-21 NOTE — Progress Notes (Signed)
51 y.o. G0P0000 Single  Caucasian Fe here for annual exam. Menopausal no vaginal bleeding or vaginal dryness.Occasional hot flashes, no issues. Not sexually active. Sees PCP yearly, for aex/management of cholesterol  and labs. 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 [place] working in [place], misses [place]. No health issues today. Planning vacation at some point!  Patient's last menstrual period was 10/04/2015.          Sexually active: No.  The current method of family planning is post menopausal status.    Exercising: No.  exercise Smoker:  no  Health Maintenance: Pap:  10-15-13 neg, 11-05-15 neg HPV HR neg History of Abnormal Pap: no MMG:  11-22-16 category b density birads 1:neg Self Breast exams: occ Colonoscopy: none BMD:  none TDaP: 2013 Shingles: no Pneumonia: no Hep C and HIV: not done Labs: none   reports that she has never smoked. She has never used smokeless tobacco. She reports that she does not drink alcohol or use drugs.  Past Medical History:  Diagnosis Date  . Environmental and seasonal allergies   . Hx of varicella   . Hypercholesterolemia 12/13   on medication  . Recurrent sinusitis    neg ct 2013   . Skin cancer of face    cryotherapy done fall 2015    Past Surgical History:  Procedure Laterality Date  . SHOULDER SURGERY Left 03/26/2008   torn labrum 3 pins  . URETHRA SURGERY  07/26/1970    Current Outpatient Prescriptions  Medication Sig Dispense Refill  . Multiple Vitamins-Minerals (MULTIVITAMIN PO) Take by mouth.     No current facility-administered medications for this visit.     Family History  Problem Relation Age of Onset  . Hypertension Father   . Gout Father   . Healthy Father        age 2  . Heart disease Mother   . Stroke Mother        in her 26s   . Migraines Mother   . Healthy Brother        2    ROS:  Pertinent items are noted in HPI.  Otherwise, a comprehensive ROS was negative.  Exam:    BP 100/60   Pulse 68   Resp 16   Ht 5' 10.25" (1.784 m)   Wt 189 lb (85.7 kg)   LMP 10/04/2015 Comment: last spotting was 1/17  BMI 26.93 kg/m  Height: 5' 10.25" (178.4 cm) Ht Readings from Last 3 Encounters:  01/21/17 5' 10.25" (1.784 m)  05/05/16 5' 10.5" (1.791 m)  03/05/16 5' 10.5" (1.791 m)    General appearance: alert, cooperative and appears stated age Head: Normocephalic, without obvious abnormality, atraumatic Neck: no adenopathy, supple, symmetrical, trachea midline and thyroid normal to inspection and palpation Lungs: clear to auscultation bilaterally Breasts: normal appearance, no masses or tenderness, No nipple retraction or dimpling, No nipple discharge or bleeding, No axillary or supraclavicular adenopathy Heart: regular rate and rhythm Abdomen: soft, non-tender; no masses,  no organomegaly Extremities: extremities normal, atraumatic, no cyanosis or edema Skin: Skin color, texture, turgor normal. No rashes or lesions Lymph nodes: Cervical, supraclavicular, and axillary nodes normal. No abnormal inguinal nodes palpated Neurologic: Grossly normal   Pelvic: External genitalia:  no lesions              Urethra:  normal appearing urethra with no masses, tenderness or lesions  Bartholin's and Skene's: normal                 Vagina: normal appearing vagina with normal color and discharge, no lesions              Cervix: no cervical motion tenderness, no lesions and nulliparous appearance              Pap taken: No. Bimanual Exam:  Uterus:  normal size, contour, position, consistency, mobility, non-tender              Adnexa: normal adnexa and no mass, fullness, tenderness               Rectovaginal: Confirms               Anus:  normal sphincter tone, no lesions  Chaperone present: yes  A:  Well Woman with normal exam  Menopausal no HRT  Cholesterol management with PCP    P:   Reviewed health and wellness pertinent to exam  Aware of  Need to  evaluate if vaginal bleeding.  Continue follow up with PCP as indicated.  Pap smear: no   counseled on breast self exam, mammography screening, HIV risk factors and prevention, adequate intake of calcium and vitamin D, diet and exercise  return annually or prn  An After Visit Summary was printed and given to the patient.

## 2017-01-21 NOTE — Patient Instructions (Signed)

## 2017-03-04 NOTE — Progress Notes (Signed)
Chief Complaint  Patient presents with  . Annual Exam    HPI: Patient  Olivia Hill  51 y.o. comes in today for Preventive Health Care visit  Check lump? r axilla  3 years  No pain no change  Is it a lump?  utd mammo. Still live [place]  commute to dc.  Sees endo [facility] for thyroid nodules  Last tsh in 2 range  To be seen about every 3 years  Working on liipids would like to be rechecked   Health Maintenance  Topic Date Due  . COLONOSCOPY  06/22/2016  . INFLUENZA VACCINE  02/23/2017  . HIV Screening  03/14/2018 (Originally 06/22/1981)  . PAP SMEAR  11/05/2018  . MAMMOGRAM  11/23/2018  . TETANUS/TDAP  07/26/2021   Health Maintenance Review LIFESTYLE:  Exercise:    Not reg .  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 Colon cancer  ROS:  GEN/ HEENT: No fever, significant weight changes sweats headaches vision problems hearing changes, CV/ PULM; No chest pain shortness of breath cough, syncope,edema  change in exercise tolerance. GI /GU: No adominal pain, vomiting, change in bowel habits. No blood in the stool. No significant GU symptoms. SKIN/HEME: ,no acute skin rashes suspicious lesions or bleeding. No lymphadenopathy, nodules, masses.  NEURO/ PSYCH:  No neurologic signs such as weakness numbness. No depression anxiety. IMM/ Allergy: No unusual infections.  Allergy .   REST of 12 system review negative except as per HPI   Past Medical History:  Diagnosis Date  . Environmental and seasonal allergies   . Hx of varicella   . Hypercholesterolemia 12/13   on medication  . Recurrent sinusitis    neg ct 2013   . Skin cancer of face    cryotherapy done fall 2015    Past Surgical History:  Procedure Laterality Date  . SHOULDER SURGERY Left 03/26/2008   torn labrum 3 pins  . URETHRA SURGERY  07/26/1970    Family History    Problem Relation Age of Onset  . Hypertension Father   . Gout Father   . Healthy Father        age 19  . Heart disease Mother   . Stroke Mother        in her 30s   . Migraines Mother   . Healthy Brother        2    Social History   Social History  . Marital status: Single    Spouse name: N/A  . Number of children: N/A  . Years of education: N/A   Social History Main Topics  . Smoking status: Never Smoker  . Smokeless tobacco: Never Used  . Alcohol use No  . Drug use: No  . Sexual activity: No   Other Topics Concern  . 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 Multiple Vitamins-Minerals (MULTIVITAMIN PO)  Take by mouth.     No facility-administered medications prior to visit.      EXAM:  BP 120/80 (BP Location: Right Arm, Patient Position: Sitting, Cuff Size: Normal)   Pulse 64   Temp (!) 97.5 F (36.4 C) (Oral)   Ht 5' 10.25" (1.784 m)   Wt 194 lb 9.6 oz (88.3 kg)   LMP 10/04/2015 Comment: last spotting was 1/17  BMI 27.72 kg/m   Body mass index is 27.72 kg/m. Wt Readings from Last 3 Encounters:  03/14/17 194 lb 9.6 oz (88.3 kg)  01/21/17 189 lb (85.7 kg)  05/05/16 191 lb (86.6 kg)    Physical Exam: Vital signs reviewed [id number] is a well-developed well-nourished alert cooperative    who appearsr stated age in no acute distress.  HEENT: normocephalic atraumatic , Eyes: PERRL EOM's full, conjunctiva clear, Nares: paten,t no deformity discharge or tenderness., Ears: no deformity EAC's clear TMs with normal landmarks. Mouth: clear OP, no lesions, edema.  Moist mucous membranes. Dentition in adequate repair. NECK: supple wp or bruits. Prominent left thyroid  CHEST/PULM:  Clear to auscultation  and percussion breath sounds equal no wheeze , rales or rhonchi. No chest wall deformities or tenderness. Breast: normal by inspection . No dimpling, discharge, masses, tenderness or discharge . Axilla prominent ? Lump vs tissues   Mildly asymmetrical  Not a discrete nodule  CV: PMI is nondisplaced, S1 S2 no gallops, murmurs, rubs. Peripheral pulses are full without delay.No JVD .  ABDOMEN: Bowel sounds normal nontender  No guard or rebound, no hepato splenomegal no CVA tenderness.  No hernia. Extremtities:  No clubbing cyanosis or edema, no acute joint swelling or redness no focal atrophy NEURO:  Oriented x3, cranial nerves 3-12 appear to be intact, no obvious focal weakness,gait within normal limits no abnormal reflexes or asymmetrical SKIN: No acute rashes normal turgor, color, no bruising or petechiae. PSYCH: Oriented, good eye contact, no obvious depression anxiety, cognition and judgment appear normal. LN: no cervical axillary inguinal adenopathy see breast exam   Lab Results  Component Value Date   HGB 13.8 11/01/2014   GLUCOSE 86 11/22/2016   CHOL 237 (H) 11/22/2016   TRIG 161.0 (H) 11/22/2016   HDL 44.70 11/22/2016   LDLCALC 161 (H) 11/22/2016   ALT 22 11/05/2015   AST 18 11/05/2015   NA 142 11/05/2015   K 4.0 11/05/2015   CL 104 11/05/2015   CREATININE 0.79 11/05/2015   BUN 22 11/05/2015   CO2 30 11/05/2015   TSH 2.17 11/05/2015   HGBA1C 5.7 11/22/2016    BP Readings from Last 3 Encounters:  03/14/17 120/80  01/21/17 100/60  05/05/16 102/70   Wt Readings from Last 3 Encounters:  03/14/17 194 lb 9.6 oz (88.3 kg)  01/21/17 189 lb (85.7 kg)  05/05/16 191 lb (86.6 kg)    Lab at [facility]:  Discussed the following assessment and plan:  Visit for preventive health examination - Plan: Lipid panel, Comprehensive metabolic panel, CBC with Differential/Platelet  Elevated triglycerides with high cholesterol - Plan: Lipid panel, Comprehensive  metabolic panel, CBC with Differential/Platelet  Hypercholesterolemia - Plan: Lipid panel, Comprehensive metabolic panel, CBC with Differential/Platelet  Hyperglycemia - Plan: Lipid panel, Comprehensive metabolic panel, CBC with Differential/Platelet Counseled regarding healthy nutrition, exercise, sleep, injury prevention, calcium vit d and healthy weight . Don';t  think the asymmetry in her right axilla is a mass just more prominent 3 years no symptoms follow exam.Fu is changing or problems  Disc colon cancer  Screening info on cologuard  Patient Care Team: Olivia Hill, Standley Brooking, MD as PCP - General (Internal Medicine) Gean Quint, MD as Consulting Physician (Allergy and Immunology) Regina Eck, CNM as Referring Physician (Certified Nurse Midwife) Danella Sensing, MD as Consulting Physician (Dermatology) sandra fuller dds  Koleen Nimrod Maxine Glenn, MD as Referring Physician (Internal Medicine) Patient Instructions    Colon cancer screening advised  Check with insurance about COLOGUARD  Contact us about direction you want to go with this \  Check into shingles vaccine. Shingrix  Can receive either in the office or pharmacy depending on insurance rules.   Preventive Care 40-64 Years, Female Preventive care refers to lifestyle choices and visits with your health care provider that can promote health and wellness. What does preventive care include?  A yearly physical exam. This is also called an annual well check.  Dental exams once or twice a year.  Routine eye exams. Ask your health care provider how often you should have your eyes checked.  Personal lifestyle choices, including: ? Daily care of your teeth and gums. ? Regular physical activity. ? Eating a healthy diet. ? Avoiding tobacco and drug use. ? Limiting alcohol use. ? Practicing safe sex. ? Taking low-dose aspirin daily starting at age 94. ? Taking vitamin and mineral supplements as recommended by your health  care provider. What happens during an annual well check? The services and screenings done by your health care provider during your annual well check will depend on your age, overall health, lifestyle risk factors, and family history of disease. Counseling Your health care provider may ask you questions about your:  Alcohol use.  Tobacco use.  Drug use.  Emotional well-being.  Home and relationship well-being.  Sexual activity.  Eating habits.  Work and work Statistician.  Method of birth control.  Menstrual cycle.  Pregnancy history.  Screening You may have the following tests or measurements:  Height, weight, and BMI.  Blood pressure.  Lipid and cholesterol levels. These may be checked every 5 years, or more frequently if you are over 34 years old.  Skin check.  Lung cancer screening. You may have this screening every year starting at age 66 if you have a 30-pack-year history of smoking and currently smoke or have quit within the past 15 years.  Fecal occult blood test (FOBT) of the stool. You may have this test every year starting at age 30.  Flexible sigmoidoscopy or colonoscopy. You may have a sigmoidoscopy every 5 years or a colonoscopy every 10 years starting at age 28.  Hepatitis C blood test.  Hepatitis B blood test.  Sexually transmitted disease (STD) testing.  Diabetes screening. This is done by checking your blood sugar (glucose) after you have not eaten for a while (fasting). You may have this done every 1-3 years.  Mammogram. This may be done every 1-2 years. Talk to your health care provider about when you should start having regular mammograms. This may depend on whether you have a family history of breast cancer.  BRCA-related cancer screening. This may be done if you have a family history of breast, ovarian, tubal, or peritoneal cancers.  Pelvic exam and Pap test. This may be done every 3 years starting at age 25. Starting at age 29, this may  be done every 5 years if you have a Pap test in combination with an HPV test.  Bone density scan. This is done to screen for osteoporosis. You may have  this scan if you are at high risk for osteoporosis.  Discuss your test results, treatment options, and if necessary, the need for more tests with your health care provider. Vaccines Your health care provider may recommend certain vaccines, such as:  Influenza vaccine. This is recommended every year.  Tetanus, diphtheria, and acellular pertussis (Tdap, Td) vaccine. You may need a Td booster every 10 years.  Varicella vaccine. You may need this if you have not been vaccinated.  Zoster vaccine. You may need this after age 51.  Measles, mumps, and rubella (MMR) vaccine. You may need at least one dose of MMR if you were born in 1957 or later. You may also need a second dose.  Pneumococcal 13-valent conjugate (PCV13) vaccine. You may need this if you have certain conditions and were not previously vaccinated.  Pneumococcal polysaccharide (PPSV23) vaccine. You may need one or two doses if you smoke cigarettes or if you have certain conditions.  Meningococcal vaccine. You may need this if you have certain conditions.  Hepatitis A vaccine. You may need this if you have certain conditions or if you travel or work in places where you may be exposed to hepatitis A.  Hepatitis B vaccine. You may need this if you have certain conditions or if you travel or work in places where you may be exposed to hepatitis B.  Haemophilus influenzae type b (Hib) vaccine. You may need this if you have certain conditions.  Talk to your health care provider about which screenings and vaccines you need and how often you need them. This information is not intended to replace advice given to you by your health care provider. Make sure you discuss any questions you have with your health care provider. Document Released: 08/08/2015 Document Revised: 03/31/2016 Document  Reviewed: 05/13/2015 Elsevier Interactive Patient Education  2017 [place] Screening Colorectal cancer screening is a group of tests used to check for colorectal cancer. Colorectal refers to your colon and rectum. Your colon and rectum are located at the end of your large intestine and carry your bowel movements out of your body. Why is colorectal cancer screening done? It is common for abnormal growths (polyps) to form in the lining of your colon, especially as you get older. These polyps can be cancerous or become cancerous. If colorectal cancer is found at an early stage, it is treatable. Who should be screened for colorectal cancer? Screening is recommended for all adults at average risk starting at age 80. Tests may be recommended every 1 to 10 years. Your health care provider may recommend earlier or more frequent screening if you have:  A history of colorectal cancer or polyps.  A family member with a history of colorectal cancer or polyps.  Inflammatory bowel disease, such as ulcerative colitis or Crohn disease.  A type of hereditary colon cancer syndrome.  Colorectal cancer symptoms.  Types of screening tests There are several types of colorectal screening tests. They include:  Guaiac-based fecal occult blood testing.  Fecal immunochemical test (FIT).  Stool DNA test.  Barium enema.  Virtual colonoscopy.  Sigmoidoscopy. During this test, a sigmoidoscope is used to examine your rectum and lower colon. A sigmoidoscope is a flexible tube with a camera that is inserted through your anus into your rectum and lower colon.  Colonoscopy. During this test, a colonoscope is used to examine your entire colon. A colonoscope is a long, thin, flexible tube with a camera. This test examines your entire  colon and rectum.  This information is not intended to replace advice given to you by your health care provider. Make sure you discuss any questions you have  with your health care provider. Document Released: 12/30/2009 Document Revised: 02/19/2016 Document Reviewed: 10/18/2013 Elsevier Interactive Patient Education  2017 [employer].     Why follow it? Research shows. . Those who follow the Mediterranean diet have a reduced risk of heart disease  . The diet is associated with a reduced incidence of Parkinson's and Alzheimer's diseases . People following the diet may have longer life expectancies and lower rates of chronic diseases  . The Dietary Guidelines for Americans recommends the Mediterranean diet as an eating plan to promote health and prevent disease  What Is the Mediterranean Diet?  . Healthy eating plan based on typical foods and recipes of Mediterranean-style cooking . The diet is primarily a plant based diet; these foods should make up a majority of meals   Starches - Plant based foods should make up a majority of meals - They are an important sources of vitamins, minerals, energy, antioxidants, and fiber - Choose whole grains, foods high in fiber and minimally processed items  - Typical grain sources include wheat, oats, barley, corn, brown rice, bulgar, farro, millet, polenta, couscous  - Various types of beans include chickpeas, lentils, fava beans, black beans, white beans   Fruits  Veggies - Large quantities of antioxidant rich fruits & veggies; 6 or more servings  - Vegetables can be eaten raw or lightly drizzled with oil and cooked  - Vegetables common to the traditional Mediterranean Diet include: artichokes, arugula, beets, broccoli, brussel sprouts, cabbage, carrots, celery, collard greens, cucumbers, eggplant, kale, leeks, lemons, lettuce, mushrooms, okra, onions, peas, peppers, potatoes, pumpkin, radishes, rutabaga, shallots, spinach, sweet potatoes, turnips, zucchini - Fruits common to the Mediterranean Diet include: apples, apricots, avocados, cherries, clementines, dates, figs, grapefruits, grapes, melons, nectarines,  oranges, peaches, pears, pomegranates, strawberries, tangerines  Fats - Replace butter and margarine with healthy oils, such as olive oil, canola oil, and tahini  - Limit nuts to no more than a handful a day  - Nuts include walnuts, almonds, pecans, pistachios, pine nuts  - Limit or avoid candied, honey roasted or heavily salted nuts - Olives are central to the [employer] - can be eaten whole or used in a variety of dishes   Meats Protein - Limiting red meat: no more than a few times a month - When eating red meat: choose lean cuts and keep the portion to the size of deck of cards - Eggs: approx. 0 to 4 times a week  - Fish and lean poultry: at least 2 a week  - Healthy protein sources include, chicken, [place], lean beef, lamb - Increase intake of seafood such as tuna, salmon, trout, mackerel, shrimp, scallops - Avoid or limit high fat processed meats such as sausage and bacon  Dairy - Include moderate amounts of low fat dairy products  - Focus on healthy dairy such as fat free yogurt, skim milk, low or reduced fat cheese - Limit dairy products higher in fat such as whole or 2% milk, cheese, ice cream  Alcohol - Moderate amounts of red wine is ok  - No more than 5 oz daily for women (all ages) and men older than age 47  - No more than 10 oz of wine daily for men younger than 8  Other - Limit sweets and other desserts  - Use herbs and  spices instead of salt to flavor foods  - Herbs and spices common to the traditional Mediterranean Diet include: basil, bay leaves, chives, cloves, cumin, fennel, garlic, lavender, marjoram, mint, oregano, parsley, pepper, rosemary, sage, savory, sumac, tarragon, thyme   It's not just a diet, it's a lifestyle:  . The Mediterranean diet includes lifestyle factors typical of those in the region  . Foods, drinks and meals are best eaten with others and savored . Daily physical activity is important for overall good health . This could be strenuous  exercise like running and aerobics . This could also be more leisurely activities such as walking, housework, yard-work, or taking the stairs . Moderation is the key; a balanced and healthy diet accommodates most foods and drinks . Consider portion sizes and frequency of consumption of certain foods   Meal Ideas & Options:  . Breakfast:  o Whole wheat toast or whole wheat English muffins with peanut butter & hard boiled egg o Steel cut oats topped with apples & cinnamon and skim milk  o Fresh fruit: banana, strawberries, melon, berries, peaches  o Smoothies: strawberries, bananas, greek yogurt, peanut butter o Low fat greek yogurt with blueberries and granola  o Egg white omelet with spinach and mushrooms o Breakfast couscous: whole wheat couscous, apricots, skim milk, cranberries  . Sandwiches:  o Hummus and grilled vegetables (peppers, zucchini, squash) on whole wheat bread   o Grilled chicken on whole wheat pita with lettuce, tomatoes, cucumbers or tzatziki  o Tuna salad on whole wheat bread: tuna salad made with greek yogurt, olives, red peppers, capers, green onions o Garlic rosemary lamb pita: lamb sauted with garlic, rosemary, salt & pepper; add lettuce, cucumber, greek yogurt to pita - flavor with lemon juice and black pepper  . Seafood:  o Mediterranean grilled salmon, seasoned with garlic, basil, parsley, lemon juice and black pepper o Shrimp, lemon, and spinach whole-grain pasta salad made with low fat greek yogurt  o Seared scallops with lemon orzo  o Seared tuna steaks seasoned salt, pepper, coriander topped with tomato mixture of olives, tomatoes, olive oil, minced garlic, parsley, green onions and cappers  . Meats:  o Herbed greek chicken salad with kalamata olives, cucumber, feta  o Red bell peppers stuffed with spinach, bulgur, lean ground beef (or lentils) & topped with feta   o Kebabs: skewers of chicken, tomatoes, onions, zucchini, squash  o [place] burgers: made with  red onions, mint, dill, lemon juice, feta cheese topped with roasted red peppers . Vegetarian o Cucumber salad: cucumbers, artichoke hearts, celery, red onion, feta cheese, tossed in olive oil & lemon juice  o Hummus and whole grain pita points with a greek salad (lettuce, tomato, feta, olives, cucumbers, red onion) o Lentil soup with celery, carrots made with vegetable broth, garlic, salt and pepper  o Tabouli salad: parsley, bulgur, mint, scallions, cucumbers, tomato, radishes, lemon juice, olive oil, salt and pepper.         Standley Brooking. Olivia Hill M.D.

## 2017-03-11 ENCOUNTER — Encounter: Payer: Federal, State, Local not specified - PPO | Admitting: Internal Medicine

## 2017-03-14 ENCOUNTER — Encounter: Payer: Self-pay | Admitting: Internal Medicine

## 2017-03-14 ENCOUNTER — Ambulatory Visit (INDEPENDENT_AMBULATORY_CARE_PROVIDER_SITE_OTHER): Payer: Federal, State, Local not specified - PPO | Admitting: Internal Medicine

## 2017-03-14 VITALS — BP 120/80 | HR 64 | Temp 97.5°F | Ht 70.25 in | Wt 194.6 lb

## 2017-03-14 DIAGNOSIS — E782 Mixed hyperlipidemia: Secondary | ICD-10-CM | POA: Diagnosis not present

## 2017-03-14 DIAGNOSIS — E7800 Pure hypercholesterolemia, unspecified: Secondary | ICD-10-CM

## 2017-03-14 DIAGNOSIS — R739 Hyperglycemia, unspecified: Secondary | ICD-10-CM | POA: Diagnosis not present

## 2017-03-14 DIAGNOSIS — Z0000 Encounter for general adult medical examination without abnormal findings: Secondary | ICD-10-CM | POA: Diagnosis not present

## 2017-03-14 LAB — COMPREHENSIVE METABOLIC PANEL
ALT: 20 U/L (ref 0–35)
AST: 18 U/L (ref 0–37)
Albumin: 4.3 g/dL (ref 3.5–5.2)
Alkaline Phosphatase: 97 U/L (ref 39–117)
BUN: 18 mg/dL (ref 6–23)
CO2: 30 mEq/L (ref 19–32)
Calcium: 10 mg/dL (ref 8.4–10.5)
Chloride: 101 mEq/L (ref 96–112)
Creatinine, Ser: 0.84 mg/dL (ref 0.40–1.20)
GFR: 76.06 mL/min (ref >60.00)
Glucose, Bld: 91 mg/dL (ref 70–99)
Potassium: 4.1 mEq/L (ref 3.5–5.1)
Sodium: 138 mEq/L (ref 135–145)
Total Bilirubin: 0.5 mg/dL (ref 0.2–1.2)
Total Protein: 7.5 g/dL (ref 6.0–8.3)

## 2017-03-14 LAB — CBC WITH DIFFERENTIAL/PLATELET
Basophils Absolute: 0 10*3/uL (ref 0.0–0.1)
Basophils Relative: 0.5 % (ref 0.0–3.0)
Eosinophils Absolute: 0.2 10*3/uL (ref 0.0–0.7)
Eosinophils Relative: 3.2 % (ref 0.0–5.0)
HCT: 42.4 % (ref 36.0–46.0)
Hemoglobin: 14.2 g/dL (ref 12.0–15.0)
Lymphocytes Relative: 40.7 % (ref 12.0–46.0)
Lymphs Abs: 2.4 10*3/uL (ref 0.7–4.0)
MCHC: 33.4 g/dL (ref 30.0–36.0)
MCV: 96 fl (ref 78.0–100.0)
Monocytes Absolute: 0.4 10*3/uL (ref 0.1–1.0)
Monocytes Relative: 6.2 % (ref 3.0–12.0)
Neutro Abs: 2.9 10*3/uL (ref 1.4–7.7)
Neutrophils Relative %: 49.4 % (ref 43.0–77.0)
Platelets: 249 10*3/uL (ref 150.0–400.0)
RBC: 4.41 Mil/uL (ref 3.87–5.11)
RDW: 13 % (ref 11.5–15.5)
WBC: 5.9 10*3/uL (ref 4.0–10.5)

## 2017-03-14 LAB — LIPID PANEL
Cholesterol: 228 mg/dL — ABNORMAL HIGH (ref 0–200)
HDL: 46.5 mg/dL (ref >39.00)
LDL Cholesterol: 143 mg/dL — ABNORMAL HIGH (ref 0–99)
NonHDL: 181.28
Total CHOL/HDL Ratio: 5
Triglycerides: 190 mg/dL — ABNORMAL HIGH (ref 0.0–149.0)
VLDL: 38 mg/dL (ref 0.0–40.0)

## 2017-03-14 NOTE — Patient Instructions (Addendum)
Colon cancer screening advised  Check with insurance about COLOGUARD  Contact us about direction you want to go with this \  Check into shingles vaccine. Shingrix  Can receive either in the office or pharmacy depending on insurance rules.   Preventive Care 40-64 Years, Female Preventive care refers to lifestyle choices and visits with your health care provider that can promote health and wellness. What does preventive care include?  A yearly physical exam. This is also called an annual well check.  Dental exams once or twice a year.  Routine eye exams. Ask your health care provider how often you should have your eyes checked.  Personal lifestyle choices, including: ? Daily care of your teeth and gums. ? Regular physical activity. ? Eating a healthy diet. ? Avoiding tobacco and drug use. ? Limiting alcohol use. ? Practicing safe sex. ? Taking low-dose aspirin daily starting at age 29. ? Taking vitamin and mineral supplements as recommended by your health care provider. What happens during an annual well check? The services and screenings done by your health care provider during your annual well check will depend on your age, overall health, lifestyle risk factors, and family history of disease. Counseling Your health care provider may ask you questions about your:  Alcohol use.  Tobacco use.  Drug use.  Emotional well-being.  Home and relationship well-being.  Sexual activity.  Eating habits.  Work and work Statistician.  Method of birth control.  Menstrual cycle.  Pregnancy history.  Screening You may have the following tests or measurements:  Height, weight, and BMI.  Blood pressure.  Lipid and cholesterol levels. These may be checked every 5 years, or more frequently if you are over 40 years old.  Skin check.  Lung cancer screening. You may have this screening every year starting at age 59 if you have a 30-pack-year history of smoking and currently  smoke or have quit within the past 15 years.  Fecal occult blood test (FOBT) of the stool. You may have this test every year starting at age 51.  Flexible sigmoidoscopy or colonoscopy. You may have a sigmoidoscopy every 5 years or a colonoscopy every 10 years starting at age 40.  Hepatitis C blood test.  Hepatitis B blood test.  Sexually transmitted disease (STD) testing.  Diabetes screening. This is done by checking your blood sugar (glucose) after you have not eaten for a while (fasting). You may have this done every 1-3 years.  Mammogram. This may be done every 1-2 years. Talk to your health care provider about when you should start having regular mammograms. This may depend on whether you have a family history of breast cancer.  BRCA-related cancer screening. This may be done if you have a family history of breast, ovarian, tubal, or peritoneal cancers.  Pelvic exam and Pap test. This may be done every 3 years starting at age 51. Starting at age 76, this may be done every 5 years if you have a Pap test in combination with an HPV test.  Bone density scan. This is done to screen for osteoporosis. You may have this scan if you are at high risk for osteoporosis.  Discuss your test results, treatment options, and if necessary, the need for more tests with your health care provider. Vaccines Your health care provider may recommend certain vaccines, such as:  Influenza vaccine. This is recommended every year.  Tetanus, diphtheria, and acellular pertussis (Tdap, Td) vaccine. You may need a Td booster every 10 years.  Varicella vaccine. You may need this if you have not been vaccinated.  Zoster vaccine. You may need this after age 51.  Measles, mumps, and rubella (MMR) vaccine. You may need at least one dose of MMR if you were born in 1957 or later. You may also need a second dose.  Pneumococcal 13-valent conjugate (PCV13) vaccine. You may need this if you have certain conditions and  were not previously vaccinated.  Pneumococcal polysaccharide (PPSV23) vaccine. You may need one or two doses if you smoke cigarettes or if you have certain conditions.  Meningococcal vaccine. You may need this if you have certain conditions.  Hepatitis A vaccine. You may need this if you have certain conditions or if you travel or work in places where you may be exposed to hepatitis A.  Hepatitis B vaccine. You may need this if you have certain conditions or if you travel or work in places where you may be exposed to hepatitis B.  Haemophilus influenzae type b (Hib) vaccine. You may need this if you have certain conditions.  Talk to your health care provider about which screenings and vaccines you need and how often you need them. This information is not intended to replace advice given to you by your health care provider. Make sure you discuss any questions you have with your health care provider. Document Released: 08/08/2015 Document Revised: 03/31/2016 Document Reviewed: 05/13/2015 Elsevier Interactive Patient Education  2017 [place] Screening Colorectal cancer screening is a group of tests used to check for colorectal cancer. Colorectal refers to your colon and rectum. Your colon and rectum are located at the end of your large intestine and carry your bowel movements out of your body. Why is colorectal cancer screening done? It is common for abnormal growths (polyps) to form in the lining of your colon, especially as you get older. These polyps can be cancerous or become cancerous. If colorectal cancer is found at an early stage, it is treatable. Who should be screened for colorectal cancer? Screening is recommended for all adults at average risk starting at age 51. Tests may be recommended every 1 to 10 years. Your health care provider may recommend earlier or more frequent screening if you have:  A history of colorectal cancer or polyps.  A family member  with a history of colorectal cancer or polyps.  Inflammatory bowel disease, such as ulcerative colitis or Crohn disease.  A type of hereditary colon cancer syndrome.  Colorectal cancer symptoms.  Types of screening tests There are several types of colorectal screening tests. They include:  Guaiac-based fecal occult blood testing.  Fecal immunochemical test (FIT).  Stool DNA test.  Barium enema.  Virtual colonoscopy.  Sigmoidoscopy. During this test, a sigmoidoscope is used to examine your rectum and lower colon. A sigmoidoscope is a flexible tube with a camera that is inserted through your anus into your rectum and lower colon.  Colonoscopy. During this test, a colonoscope is used to examine your entire colon. A colonoscope is a long, thin, flexible tube with a camera. This test examines your entire colon and rectum.  This information is not intended to replace advice given to you by your health care provider. Make sure you discuss any questions you have with your health care provider. Document Released: 12/30/2009 Document Revised: 02/19/2016 Document Reviewed: 10/18/2013 Elsevier Interactive Patient Education  2017 [employer].     Why follow it? Research shows. . Those who follow the Mediterranean diet have  a reduced risk of heart disease  . The diet is associated with a reduced incidence of Parkinson's and Alzheimer's diseases . People following the diet may have longer life expectancies and lower rates of chronic diseases  . The Dietary Guidelines for Americans recommends the Mediterranean diet as an eating plan to promote health and prevent disease  What Is the Mediterranean Diet?  . Healthy eating plan based on typical foods and recipes of Mediterranean-style cooking . The diet is primarily a plant based diet; these foods should make up a majority of meals   Starches - Plant based foods should make up a majority of meals - They are an important sources of vitamins,  minerals, energy, antioxidants, and fiber - Choose whole grains, foods high in fiber and minimally processed items  - Typical grain sources include wheat, oats, barley, corn, brown rice, bulgar, farro, millet, polenta, couscous  - Various types of beans include chickpeas, lentils, fava beans, black beans, white beans   Fruits  Veggies - Large quantities of antioxidant rich fruits & veggies; 6 or more servings  - Vegetables can be eaten raw or lightly drizzled with oil and cooked  - Vegetables common to the traditional Mediterranean Diet include: artichokes, arugula, beets, broccoli, brussel sprouts, cabbage, carrots, celery, collard greens, cucumbers, eggplant, kale, leeks, lemons, lettuce, mushrooms, okra, onions, peas, peppers, potatoes, pumpkin, radishes, rutabaga, shallots, spinach, sweet potatoes, turnips, zucchini - Fruits common to the Mediterranean Diet include: apples, apricots, avocados, cherries, clementines, dates, figs, grapefruits, grapes, melons, nectarines, oranges, peaches, pears, pomegranates, strawberries, tangerines  Fats - Replace butter and margarine with healthy oils, such as olive oil, canola oil, and tahini  - Limit nuts to no more than a handful a day  - Nuts include walnuts, almonds, pecans, pistachios, pine nuts  - Limit or avoid candied, honey roasted or heavily salted nuts - Olives are central to the [employer] - can be eaten whole or used in a variety of dishes   Meats Protein - Limiting red meat: no more than a few times a month - When eating red meat: choose lean cuts and keep the portion to the size of deck of cards - Eggs: approx. 0 to 4 times a week  - Fish and lean poultry: at least 2 a week  - Healthy protein sources include, chicken, [place], lean beef, lamb - Increase intake of seafood such as tuna, salmon, trout, mackerel, shrimp, scallops - Avoid or limit high fat processed meats such as sausage and bacon  Dairy - Include moderate amounts of low  fat dairy products  - Focus on healthy dairy such as fat free yogurt, skim milk, low or reduced fat cheese - Limit dairy products higher in fat such as whole or 2% milk, cheese, ice cream  Alcohol - Moderate amounts of red wine is ok  - No more than 5 oz daily for women (all ages) and men older than age 41  - No more than 10 oz of wine daily for men younger than 66  Other - Limit sweets and other desserts  - Use herbs and spices instead of salt to flavor foods  - Herbs and spices common to the traditional Mediterranean Diet include: basil, bay leaves, chives, cloves, cumin, fennel, garlic, lavender, marjoram, mint, oregano, parsley, pepper, rosemary, sage, savory, sumac, tarragon, thyme   It's not just a diet, it's a lifestyle:  . The Mediterranean diet includes lifestyle factors typical of those in the region  . Foods, drinks  and meals are best eaten with others and savored . Daily physical activity is important for overall good health . This could be strenuous exercise like running and aerobics . This could also be more leisurely activities such as walking, housework, yard-work, or taking the stairs . Moderation is the key; a balanced and healthy diet accommodates most foods and drinks . Consider portion sizes and frequency of consumption of certain foods   Meal Ideas & Options:  . Breakfast:  o Whole wheat toast or whole wheat English muffins with peanut butter & hard boiled egg o Steel cut oats topped with apples & cinnamon and skim milk  o Fresh fruit: banana, strawberries, melon, berries, peaches  o Smoothies: strawberries, bananas, greek yogurt, peanut butter o Low fat greek yogurt with blueberries and granola  o Egg white omelet with spinach and mushrooms o Breakfast couscous: whole wheat couscous, apricots, skim milk, cranberries  . Sandwiches:  o Hummus and grilled vegetables (peppers, zucchini, squash) on whole wheat bread   o Grilled chicken on whole wheat pita with lettuce,  tomatoes, cucumbers or tzatziki  o Tuna salad on whole wheat bread: tuna salad made with greek yogurt, olives, red peppers, capers, green onions o Garlic rosemary lamb pita: lamb sauted with garlic, rosemary, salt & pepper; add lettuce, cucumber, greek yogurt to pita - flavor with lemon juice and black pepper  . Seafood:  o Mediterranean grilled salmon, seasoned with garlic, basil, parsley, lemon juice and black pepper o Shrimp, lemon, and spinach whole-grain pasta salad made with low fat greek yogurt  o Seared scallops with lemon orzo  o Seared tuna steaks seasoned salt, pepper, coriander topped with tomato mixture of olives, tomatoes, olive oil, minced garlic, parsley, green onions and cappers  . Meats:  o Herbed greek chicken salad with kalamata olives, cucumber, feta  o Red bell peppers stuffed with spinach, bulgur, lean ground beef (or lentils) & topped with feta   o Kebabs: skewers of chicken, tomatoes, onions, zucchini, squash  o [place] burgers: made with red onions, mint, dill, lemon juice, feta cheese topped with roasted red peppers . Vegetarian o Cucumber salad: cucumbers, artichoke hearts, celery, red onion, feta cheese, tossed in olive oil & lemon juice  o Hummus and whole grain pita points with a greek salad (lettuce, tomato, feta, olives, cucumbers, red onion) o Lentil soup with celery, carrots made with vegetable broth, garlic, salt and pepper  o Tabouli salad: parsley, bulgur, mint, scallions, cucumbers, tomato, radishes, lemon juice, olive oil, salt and pepper.

## 2017-03-24 ENCOUNTER — Telehealth: Payer: Self-pay | Admitting: Internal Medicine

## 2017-03-24 NOTE — Telephone Encounter (Signed)
Torrie pt is calling to get lab results

## 2017-03-24 NOTE — Telephone Encounter (Signed)
Pt has check with her insurance company and its covered. Pt would like cologuard kit

## 2017-03-24 NOTE — Telephone Encounter (Signed)
Spoke with patient regarding lab results nothing further needed

## 2017-03-30 NOTE — Telephone Encounter (Signed)
Ok to order cologuard

## 2017-03-30 NOTE — Telephone Encounter (Signed)
Pt checked with her insurance and they will cover the cologuard. Anything else you needed from pt before we call back to discuss paper work

## 2017-04-01 NOTE — Telephone Encounter (Signed)
Spoke with patient cologuard form has been faxed

## 2017-04-15 ENCOUNTER — Encounter: Payer: Self-pay | Admitting: Internal Medicine

## 2017-04-26 LAB — COLOGUARD: Cologuard: NEGATIVE

## 2017-05-09 ENCOUNTER — Encounter: Payer: Self-pay | Admitting: Internal Medicine

## 2017-12-20 ENCOUNTER — Ambulatory Visit (INDEPENDENT_AMBULATORY_CARE_PROVIDER_SITE_OTHER): Payer: Federal, State, Local not specified - PPO | Admitting: Certified Nurse Midwife

## 2017-12-20 ENCOUNTER — Encounter: Payer: Self-pay | Admitting: Certified Nurse Midwife

## 2017-12-20 VITALS — BP 110/70 | HR 70 | Resp 16 | Ht 70.25 in | Wt 203.0 lb

## 2017-12-20 DIAGNOSIS — N951 Menopausal and female climacteric states: Secondary | ICD-10-CM

## 2017-12-20 DIAGNOSIS — Z01419 Encounter for gynecological examination (general) (routine) without abnormal findings: Secondary | ICD-10-CM

## 2017-12-20 NOTE — Patient Instructions (Signed)

## 2017-12-20 NOTE — Progress Notes (Signed)
52 y.o. G0P0000 Single  Caucasian Fe here for annual exam.  Menopausal no HRT. Denies vaginal dryness, or vaginal bleeding. Occasional night sweats , no insomnia. Sees Dr. Regis Bill for aex in fall, with labs. Still working in [place] , [place] and care here in [place]. Aware she has gained weight with her [employer] and plans to work on. Not sexually active. No health issues today.  Patient's last menstrual period was 10/04/2015.          Sexually active: No.  The current method of family planning is post menopausal status.    Exercising: No.  exercise Smoker:  no  Health Maintenance: Pap:  11-05-15 neg HPV HR neg History of Abnormal Pap: no MMG:  11-22-16 category b density birads 1:neg, scheduled for 6/19 Self Breast exams: yes Colonoscopy:  None, did cologard 2018 BMD:   none TDaP:  2013 Shingles: no Pneumonia: no Hep C and HIV: not done Labs: no   reports that she has never smoked. She has never used smokeless tobacco. She reports that she does not drink alcohol or use drugs.  Past Medical History:  Diagnosis Date  . Environmental and seasonal allergies   . Hx of varicella   . Hypercholesterolemia 12/13   on medication  . Recurrent sinusitis    neg ct 2013   . Skin cancer of face    cryotherapy done fall 2015    Past Surgical History:  Procedure Laterality Date  . SHOULDER SURGERY Left 03/26/2008   torn labrum 3 pins  . URETHRA SURGERY  07/26/1970    Current Outpatient Medications  Medication Sig Dispense Refill  . Multiple Vitamins-Minerals (MULTIVITAMIN PO) Take by mouth.     No current facility-administered medications for this visit.     Family History  Problem Relation Age of Onset  . Hypertension Father   . Gout Father   . Healthy Father        age 91  . Heart disease Mother   . Stroke Mother        in her 70s   . Migraines Mother   . Healthy Brother        2    ROS:  Pertinent items are noted in HPI.  Otherwise, a comprehensive ROS was negative.  Exam:    LMP 10/04/2015 Comment: last spotting was 1/17   Ht Readings from Last 3 Encounters:  03/14/17 5' 10.25" (1.784 m)  01/21/17 5' 10.25" (1.784 m)  05/05/16 5' 10.5" (1.791 m)    General appearance: alert, cooperative and appears stated age Head: Normocephalic, without obvious abnormality, atraumatic Neck: no adenopathy, supple, symmetrical, trachea midline and thyroid normal to inspection and palpation Lungs: clear to auscultation bilaterally Breasts: normal appearance, no masses or tenderness, No nipple retraction or dimpling, No nipple discharge or bleeding, No axillary or supraclavicular adenopathy Heart: regular rate and rhythm Abdomen: soft, non-tender; no masses,  no organomegaly Extremities: extremities normal, atraumatic, no cyanosis or edema Skin: Skin color, texture, turgor normal. No rashes or lesions Lymph nodes: Cervical, supraclavicular, and axillary nodes normal. No abnormal inguinal nodes palpated Neurologic: Grossly normal   Pelvic: External genitalia:  no lesions              Urethra:  normal appearing urethra with no masses, tenderness or lesions              Bartholin's and Skene's: normal                 Vagina: normal appearing  vagina with normal color and discharge, no lesions              Cervix: no cervical motion tenderness, no lesions and nulliparous appearance              Pap taken: No. Bimanual Exam:  Uterus:  normal size, contour, position, consistency, mobility, non-tender and anteverted              Adnexa: normal adnexa and no mass, fullness, tenderness               Rectovaginal: Confirms               Anus:  normal sphincter tone, no lesions  Chaperone present: yes  A:  Well Woman with normal exam  Contraception none needed  Weight gain  Labs/aex with PCP in fall  P:   Reviewed health and wellness pertinent to exam  Discussed weight gain and staying healthy. Discussed cutting about on sweet drinks for weight loss. Work on exercise and good  diet. Questions addressed.  Continue follow up with PCP in fall for labs.  Pap smear: no   counseled on breast self exam, mammography screening, menopause, adequate intake of calcium and vitamin D, diet and exercise  return annually or prn  An After Visit Summary was printed and given to the patient.

## 2018-01-25 ENCOUNTER — Ambulatory Visit: Payer: Federal, State, Local not specified - PPO | Admitting: Certified Nurse Midwife

## 2018-07-03 DIAGNOSIS — Z1231 Encounter for screening mammogram for malignant neoplasm of breast: Secondary | ICD-10-CM | POA: Diagnosis not present

## 2018-07-03 DIAGNOSIS — E041 Nontoxic single thyroid nodule: Secondary | ICD-10-CM | POA: Diagnosis not present

## 2018-07-03 DIAGNOSIS — Z803 Family history of malignant neoplasm of breast: Secondary | ICD-10-CM | POA: Diagnosis not present

## 2018-07-21 ENCOUNTER — Encounter: Payer: Federal, State, Local not specified - PPO | Admitting: Internal Medicine

## 2018-08-12 NOTE — Progress Notes (Signed)
Chief Complaint  Patient presents with  . Annual Exam    No new concerns    HPI: Patient  Olivia Hill  53 y.o. comes in today for Preventive Health Care visit   60  4 d per week.  In [place].  Next year.   Partly detached retina.  Plans  [place]ip to [place] walking  Per day  Form for exercise program now .  Had gyne check in May. Health Maintenance  Topic Date Due  . HIV Screening  06/22/1981  . PAP SMEAR-Modifier  11/05/2018  . Fecal DNA (Cologuard)  04/26/2020  . MAMMOGRAM  07/03/2020  . TETANUS/TDAP  07/26/2021  . INFLUENZA VACCINE  Completed   Health Maintenance Review LIFESTYLE:  Exercise:   Not enough  Tobacco/ETS:no Alcohol:  No to minimal  Sugar beverages:  Not bad  But starbucks   Sleep: about   8 hours   Drug use: no HH of 1 2 cats  Work:  Too  many 40 +   No anx dpression  ROS:    GEN/ HEENT: No fever, significant weight changes sweats headaches vision problems hearing changes, CV/ PULM; No chest pain shortness of breath cough, syncope,edema  change in exercise tolerance. GI /GU: No adominal pain, vomiting, change in bowel habits. No blood in the stool. No significant GU symptoms. SKIN/HEME: ,no acute skin rashes suspicious lesions or bleeding. No lymphadenopathy, nodules, masses.  NEURO/ PSYCH:  No neurologic signs such as weakness numbness. No depression anxiety. IMM/ Allergy: No unusual infections.  Allergy .   REST of 12 system review negative except as per HPI   Past Medical History:  Diagnosis Date  . Environmental and seasonal allergies   . Hx of varicella   . Hypercholesterolemia 12/13   on medication  . Recurrent sinusitis    neg ct 2013   . Skin cancer of face    cryotherapy done fall 2015    Past Surgical History:  Procedure Laterality Date  . SHOULDER SURGERY Left 03/26/2008   torn labrum 3 pins  . URETHRA SURGERY  07/26/1970    Family History  Problem Relation Age of Onset  . Hypertension Father   . Gout Father     . Healthy Father        age 64  . Heart disease Mother   . Stroke Mother        in her 92s   . Migraines Mother   . Healthy Brother        2    Social History   Socioeconomic History  . Marital status: Single    Spouse name: Not on file  . Number of children: Not on file  . Years of education: Not on file  . Highest education level: Not on file  Occupational History  . Not on file  Social Needs  . Financial resource strain: Not on file  . Food insecurity:    Worry: Not on file    Inability: Not on file  . Transportation needs:    Medical: Not on file    Non-medical: Not on file  Tobacco Use  . Smoking status: Never Smoker  . Smokeless tobacco: Never Used  Substance and Sexual Activity  . Alcohol use: No  . Drug use: No  . Sexual activity: Not Currently    Partners: Male    Birth control/protection: Post-menopausal  Lifestyle  . Physical activity:    Days per week: Not on file  Minutes per session: Not on file  . Stress: Not on file  Relationships  . Social connections:    Talks on phone: Not on file    Gets together: Not on file    Attends religious service: Not on file    Active member of club or organization: Not on file    Attends meetings of clubs or organizations: Not on file    Relationship status: Not on file  Other Topics Concern  . 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 Multiple Vitamins-Minerals (MULTIVITAMIN PO) Take by mouth.     No facility-administered medications prior to visit.      EXAM:  BP 128/84 (BP Location: Right Arm, Patient Position: Sitting, Cuff Size: Normal)   Pulse 76   Temp (!) 97.5 F (36.4 C) (Oral)   Ht 5' 9.5" (1.765 m)   Wt 203 lb 4.8 oz (92.2 kg)    LMP 10/04/2015 Comment: last spotting was 1/17  BMI 29.59 kg/m   Body mass index is 29.59 kg/m. Wt Readings from Last 3 Encounters:  08/14/18 203 lb 4.8 oz (92.2 kg)  12/20/17 203 lb (92.1 kg)  03/14/17 194 lb 9.6 oz (88.3 kg)    Physical Exam: Vital signs reviewed EHM:CNOB is a well-developed well-nourished alert cooperative    who appearsr stated age in no acute distress.  HEENT: normocephalic atraumatic , Eyes: PERRL EOM's full, conjunctiva clear, Nares: paten,t no deformity discharge or tenderness., Ears: no deformity EAC's clear TMs with normal landmarks. Mouth: clear OP, no lesions, edema.  Moist mucous membranes. Dentition in adequate repair. NECK: supple without masses, thyromegaly or bruits. CHEST/PULM:  Clear to auscultation and percussion breath sounds equal no wheeze , rales or rhonchi. No chest wall deformities or tenderness. Breast: normal by inspection . No dimpling, discharge, masses, tenderness or discharge . CV: PMI is nondisplaced, S1 S2 no gallops, murmurs, rubs. Peripheral pulses are full without delay.No JVD .  ABDOMEN: Bowel sounds normal nontender  No guard or rebound, no hepato splenomegal no CVA tenderness. Extremtities:  No clubbing cyanosis or edema, no acute joint swelling or redness no focal atrophy NEURO:  Oriented x3, cranial nerves 3-12 appear to be intact, no obvious focal weakness,gait within normal limits no abnormal reflexes or asymmetrical SKIN: No acute rashes normal turgor, color, no bruising or petechiae. PSYCH: Oriented, good eye contact, no obvious depression anxiety, cognition and judgment appear normal. LN: no cervical axillary inguinal adenopathy  Lab Results  Component Value Date   WBC 5.9 03/14/2017   HGB 14.2 03/14/2017   HCT 42.4 03/14/2017   PLT 249.0 03/14/2017   GLUCOSE 91 03/14/2017   CHOL 228 (H) 03/14/2017   TRIG 190.0 (H) 03/14/2017   HDL 46.50 03/14/2017   LDLCALC 143 (H) 03/14/2017   ALT 20 03/14/2017   AST 18  03/14/2017   NA 138 03/14/2017   K 4.1 03/14/2017   CL 101 03/14/2017   CREATININE 0.84 03/14/2017   BUN 18 03/14/2017   CO2 30 03/14/2017   TSH 2.17 11/05/2015   HGBA1C 5.7 11/22/2016    BP Readings from Last 3 Encounters:  08/14/18 128/84  12/20/17 110/70  03/14/17 120/80   Wt Readings from Last 3 Encounters:  08/14/18 203 lb 4.8 oz (92.2 kg)  12/20/17 203 lb (92.1 kg)  03/14/17 194 lb 9.6 oz (88.3 kg)    Lab    Ate last  Night  .  So fasting   ASSESSMENT AND PLAN:  Discussed the following assessment and plan:  Visit for preventive health examination - Plan: Basic metabolic panel, CBC with Differential/Platelet, Hepatic function panel, Lipid panel, TSH, Hemoglobin A1c  Medication management - Plan: Basic metabolic panel, CBC with Differential/Platelet, Hepatic function panel, Lipid panel, TSH, Hemoglobin A1c  Elevated triglycerides with high cholesterol - Plan: Basic metabolic panel, CBC with Differential/Platelet, Hepatic function panel, Lipid panel, TSH, Hemoglobin A1c  Hyperglycemia - was normal last year checking fasting today with a1c  to intensify lsi  - Plan: Basic metabolic panel, CBC with Differential/Platelet, Hepatic function panel, Lipid panel, TSH, Hemoglobin A1c Form signed no restriction for exercise program .  Agree with healthy weight  Control  Last blood sugar was normal  Patient Care Team: Panosh, Standley Brooking, MD as PCP - General (Internal Medicine) Gean Quint, MD as Consulting Physician (Allergy and Immunology) Regina Eck, CNM as Referring Physician (Certified Nurse Midwife) Danella Sensing, MD as Consulting Physician (Dermatology) sandra fuller dds  Koleen Nimrod Maxine Glenn, MD as Referring Physician (Internal Medicine) Patient Instructions  Agree with walking  Getting weight down  To below bmi of 27   Will notify you  of labs when available.  Have a good year    Preventive Care 40-64 Years, Female Preventive care refers to  lifestyle choices and visits with your health care provider that can promote health and wellness. What does preventive care include?   A yearly physical exam. This is also called an annual well check.  Dental exams once or twice a year.  Routine eye exams. Ask your health care provider how often you should have your eyes checked.  Personal lifestyle choices, including: ? Daily care of your teeth and gums. ? Regular physical activity. ? Eating a healthy diet. ? Avoiding tobacco and drug use. ? Limiting alcohol use. ? Practicing safe sex. ? Taking low-dose aspirin daily starting at age 37. ? Taking vitamin and mineral supplements as recommended by your health care provider. What happens during an annual well check? The services and screenings done by your health care provider during your annual well check will depend on your age, overall health, lifestyle risk factors, and family history of disease. Counseling Your health care provider may ask you questions about your:  Alcohol use.  Tobacco use.  Drug use.  Emotional well-being.  Home and relationship well-being.  Sexual activity.  Eating habits.  Work and work Statistician.  Method of birth control.  Menstrual cycle.  Pregnancy history. Screening You may have the following tests or measurements:  Height, weight, and BMI.  Blood pressure.  Lipid and cholesterol levels. These may be checked every 5 years, or more frequently if you are over 59 years old.  Skin check.  Lung cancer screening. You may have this screening every year starting at age 62 if you have a 30-pack-year history of smoking and currently smoke or have quit within the past 15 years.  Colorectal cancer screening. All adults should have this screening starting at age 34 and continuing until age 14. Your health care provider may recommend screening at age 89. You will have tests every 1-10 years, depending on your results and the  type of screening  test. People at increased risk should start screening at an earlier age. Screening tests may include: ? Guaiac-based fecal occult blood testing. ? Fecal immunochemical test (FIT). ? Stool DNA test. ? Virtual colonoscopy. ? Sigmoidoscopy. During this test, a flexible tube with a tiny camera (sigmoidoscope) is used to examine your rectum and lower colon. The sigmoidoscope is inserted through your anus into your rectum and lower colon. ? Colonoscopy. During this test, a long, thin, flexible tube with a tiny camera (colonoscope) is used to examine your entire colon and rectum.  Hepatitis C blood test.  Hepatitis B blood test.  Sexually transmitted disease (STD) testing.  Diabetes screening. This is done by checking your blood sugar (glucose) after you have not eaten for a while (fasting). You may have this done every 1-3 years.  Mammogram. This may be done every 1-2 years. Talk to your health care provider about when you should start having regular mammograms. This may depend on whether you have a family history of breast cancer.  BRCA-related cancer screening. This may be done if you have a family history of breast, ovarian, tubal, or peritoneal cancers.  Pelvic exam and Pap test. This may be done every 3 years starting at age 66. Starting at age 20, this may be done every 5 years if you have a Pap test in combination with an HPV test.  Bone density scan. This is done to screen for osteoporosis. You may have this scan if you are at high risk for osteoporosis. Discuss your test results, treatment options, and if necessary, the need for more tests with your health care provider. Vaccines Your health care provider may recommend certain vaccines, such as:  Influenza vaccine. This is recommended every year.  Tetanus, diphtheria, and acellular pertussis (Tdap, Td) vaccine. You may need a Td booster every 10 years.  Varicella vaccine. You may need this if you have not been vaccinated.  Zoster  vaccine. You may need this after age 19.  Measles, mumps, and rubella (MMR) vaccine. You may need at least one dose of MMR if you were born in 1957 or later. You may also need a second dose.  Pneumococcal 13-valent conjugate (PCV13) vaccine. You may need this if you have certain conditions and were not previously vaccinated.  Pneumococcal polysaccharide (PPSV23) vaccine. You may need one or two doses if you smoke cigarettes or if you have certain conditions.  Meningococcal vaccine. You may need this if you have certain conditions.  Hepatitis A vaccine. You may need this if you have certain conditions or if you travel or work in places where you may be exposed to hepatitis A.  Hepatitis B vaccine. You may need this if you have certain conditions or if you travel or work in places where you may be exposed to hepatitis B.  Haemophilus influenzae type b (Hib) vaccine. You may need this if you have certain conditions. Talk to your health care provider about which screenings and vaccines you need and how often you need them. This information is not intended to replace advice given to you by your health care provider. Make sure you discuss any questions you have with your health care provider. Document Released: 08/08/2015 Document Revised: 09/01/2017 Document Reviewed: 05/13/2015 Elsevier Interactive Patient Education  2019 Will K. Panosh M.D.

## 2018-08-12 NOTE — Patient Instructions (Addendum)
Agree with walking  Getting weight down  To below bmi of 27   Will notify you  of labs when available.  Have a good year    Preventive Care 40-64 Years, Female Preventive care refers to lifestyle choices and visits with your health care provider that can promote health and wellness. What does preventive care include?   A yearly physical exam. This is also called an annual well check.  Dental exams once or twice a year.  Routine eye exams. Ask your health care provider how often you should have your eyes checked.  Personal lifestyle choices, including: ? Daily care of your teeth and gums. ? Regular physical activity. ? Eating a healthy diet. ? Avoiding tobacco and drug use. ? Limiting alcohol use. ? Practicing safe sex. ? Taking low-dose aspirin daily starting at age 52. ? Taking vitamin and mineral supplements as recommended by your health care provider. What happens during an annual well check? The services and screenings done by your health care provider during your annual well check will depend on your age, overall health, lifestyle risk factors, and family history of disease. Counseling Your health care provider may ask you questions about your:  Alcohol use.  Tobacco use.  Drug use.  Emotional well-being.  Home and relationship well-being.  Sexual activity.  Eating habits.  Work and work Statistician.  Method of birth control.  Menstrual cycle.  Pregnancy history. Screening You may have the following tests or measurements:  Height, weight, and BMI.  Blood pressure.  Lipid and cholesterol levels. These may be checked every 5 years, or more frequently if you are over 58 years old.  Skin check.  Lung cancer screening. You may have this screening every year starting at age 42 if you have a 30-pack-year history of smoking and currently smoke or have quit within the past 15 years.  Colorectal cancer screening. All adults should have this screening starting  at age 67 and continuing until age 4. Your health care provider may recommend screening at age 43. You will have tests every 1-10 years, depending on your results and the type of screening test. People at increased risk should start screening at an earlier age. Screening tests may include: ? Guaiac-based fecal occult blood testing. ? Fecal immunochemical test (FIT). ? Stool DNA test. ? Virtual colonoscopy. ? Sigmoidoscopy. During this test, a flexible tube with a tiny camera (sigmoidoscope) is used to examine your rectum and lower colon. The sigmoidoscope is inserted through your anus into your rectum and lower colon. ? Colonoscopy. During this test, a long, thin, flexible tube with a tiny camera (colonoscope) is used to examine your entire colon and rectum.  Hepatitis C blood test.  Hepatitis B blood test.  Sexually transmitted disease (STD) testing.  Diabetes screening. This is done by checking your blood sugar (glucose) after you have not eaten for a while (fasting). You may have this done every 1-3 years.  Mammogram. This may be done every 1-2 years. Talk to your health care provider about when you should start having regular mammograms. This may depend on whether you have a family history of breast cancer.  BRCA-related cancer screening. This may be done if you have a family history of breast, ovarian, tubal, or peritoneal cancers.  Pelvic exam and Pap test. This may be done every 3 years starting at age 45. Starting at age 21, this may be done every 5 years if you have a Pap test in combination with an HPV  test.  Bone density scan. This is done to screen for osteoporosis. You may have this scan if you are at high risk for osteoporosis. Discuss your test results, treatment options, and if necessary, the need for more tests with your health care provider. Vaccines Your health care provider may recommend certain vaccines, such as:  Influenza vaccine. This is recommended every  year.  Tetanus, diphtheria, and acellular pertussis (Tdap, Td) vaccine. You may need a Td booster every 10 years.  Varicella vaccine. You may need this if you have not been vaccinated.  Zoster vaccine. You may need this after age 17.  Measles, mumps, and rubella (MMR) vaccine. You may need at least one dose of MMR if you were born in 1957 or later. You may also need a second dose.  Pneumococcal 13-valent conjugate (PCV13) vaccine. You may need this if you have certain conditions and were not previously vaccinated.  Pneumococcal polysaccharide (PPSV23) vaccine. You may need one or two doses if you smoke cigarettes or if you have certain conditions.  Meningococcal vaccine. You may need this if you have certain conditions.  Hepatitis A vaccine. You may need this if you have certain conditions or if you travel or work in places where you may be exposed to hepatitis A.  Hepatitis B vaccine. You may need this if you have certain conditions or if you travel or work in places where you may be exposed to hepatitis B.  Haemophilus influenzae type b (Hib) vaccine. You may need this if you have certain conditions. Talk to your health care provider about which screenings and vaccines you need and how often you need them. This information is not intended to replace advice given to you by your health care provider. Make sure you discuss any questions you have with your health care provider. Document Released: 08/08/2015 Document Revised: 09/01/2017 Document Reviewed: 05/13/2015 Elsevier Interactive Patient Education  2019 [employer].

## 2018-08-14 ENCOUNTER — Ambulatory Visit (INDEPENDENT_AMBULATORY_CARE_PROVIDER_SITE_OTHER): Payer: Federal, State, Local not specified - PPO | Admitting: Internal Medicine

## 2018-08-14 ENCOUNTER — Encounter: Payer: Self-pay | Admitting: Internal Medicine

## 2018-08-14 VITALS — BP 128/84 | HR 76 | Temp 97.5°F | Ht 69.5 in | Wt 203.3 lb

## 2018-08-14 DIAGNOSIS — Z79899 Other long term (current) drug therapy: Secondary | ICD-10-CM | POA: Diagnosis not present

## 2018-08-14 DIAGNOSIS — R739 Hyperglycemia, unspecified: Secondary | ICD-10-CM | POA: Diagnosis not present

## 2018-08-14 DIAGNOSIS — Z0000 Encounter for general adult medical examination without abnormal findings: Secondary | ICD-10-CM

## 2018-08-14 DIAGNOSIS — E782 Mixed hyperlipidemia: Secondary | ICD-10-CM

## 2018-08-14 LAB — BASIC METABOLIC PANEL
BUN: 15 mg/dL (ref 6–23)
CO2: 30 mEq/L (ref 19–32)
Calcium: 10.5 mg/dL (ref 8.4–10.5)
Chloride: 100 mEq/L (ref 96–112)
Creatinine, Ser: 0.93 mg/dL (ref 0.40–1.20)
GFR: 63.27 mL/min (ref >60.00)
Glucose, Bld: 89 mg/dL (ref 70–99)
Potassium: 4.4 mEq/L (ref 3.5–5.1)
Sodium: 140 mEq/L (ref 135–145)

## 2018-08-14 LAB — CBC WITH DIFFERENTIAL/PLATELET
Basophils Absolute: 0.1 10*3/uL (ref 0.0–0.1)
Basophils Relative: 1.2 % (ref 0.0–3.0)
Eosinophils Absolute: 0.4 10*3/uL (ref 0.0–0.7)
Eosinophils Relative: 4.5 % (ref 0.0–5.0)
HCT: 44.4 % (ref 36.0–46.0)
Hemoglobin: 15.3 g/dL — ABNORMAL HIGH (ref 12.0–15.0)
Lymphocytes Relative: 41.1 % (ref 12.0–46.0)
Lymphs Abs: 3.3 10*3/uL (ref 0.7–4.0)
MCHC: 34.5 g/dL (ref 30.0–36.0)
MCV: 93 fl (ref 78.0–100.0)
Monocytes Absolute: 0.5 10*3/uL (ref 0.1–1.0)
Monocytes Relative: 5.8 % (ref 3.0–12.0)
Neutro Abs: 3.8 10*3/uL (ref 1.4–7.7)
Neutrophils Relative %: 47.4 % (ref 43.0–77.0)
Platelets: 285 10*3/uL (ref 150.0–400.0)
RBC: 4.77 Mil/uL (ref 3.87–5.11)
RDW: 13.1 % (ref 11.5–15.5)
WBC: 8.1 10*3/uL (ref 4.0–10.5)

## 2018-08-14 LAB — LIPID PANEL
Cholesterol: 278 mg/dL — ABNORMAL HIGH (ref 0–200)
HDL: 43.2 mg/dL (ref >39.00)
NonHDL: 234.45
Total CHOL/HDL Ratio: 6
Triglycerides: 285 mg/dL — ABNORMAL HIGH (ref 0.0–149.0)
VLDL: 57 mg/dL — ABNORMAL HIGH (ref 0.0–40.0)

## 2018-08-14 LAB — HEPATIC FUNCTION PANEL
ALT: 30 U/L (ref 0–35)
AST: 23 U/L (ref 0–37)
Albumin: 4.8 g/dL (ref 3.5–5.2)
Alkaline Phosphatase: 99 U/L (ref 39–117)
Bilirubin, Direct: 0.1 mg/dL (ref 0.0–0.3)
Total Bilirubin: 0.5 mg/dL (ref 0.2–1.2)
Total Protein: 7.7 g/dL (ref 6.0–8.3)

## 2018-08-14 LAB — LDL CHOLESTEROL, DIRECT: Direct LDL: 184 mg/dL

## 2018-08-14 LAB — HEMOGLOBIN A1C: Hgb A1c MFr Bld: 5.4 % (ref 4.6–6.5)

## 2018-08-14 LAB — TSH: TSH: 2.7 u[IU]/mL (ref 0.35–4.50)

## 2018-09-11 DIAGNOSIS — M542 Cervicalgia: Secondary | ICD-10-CM | POA: Diagnosis not present

## 2018-09-11 DIAGNOSIS — M25512 Pain in left shoulder: Secondary | ICD-10-CM | POA: Diagnosis not present

## 2018-09-18 DIAGNOSIS — M542 Cervicalgia: Secondary | ICD-10-CM | POA: Diagnosis not present

## 2018-09-25 DIAGNOSIS — M542 Cervicalgia: Secondary | ICD-10-CM | POA: Diagnosis not present

## 2018-09-25 DIAGNOSIS — M25512 Pain in left shoulder: Secondary | ICD-10-CM | POA: Diagnosis not present

## 2018-12-15 ENCOUNTER — Ambulatory Visit: Payer: Federal, State, Local not specified - PPO | Admitting: Certified Nurse Midwife

## 2018-12-19 ENCOUNTER — Ambulatory Visit: Payer: Federal, State, Local not specified - PPO | Admitting: Certified Nurse Midwife

## 2018-12-25 DIAGNOSIS — G510 Bell's palsy: Secondary | ICD-10-CM

## 2018-12-25 HISTORY — DX: Bell's palsy: G51.0

## 2019-01-08 DIAGNOSIS — L821 Other seborrheic keratosis: Secondary | ICD-10-CM | POA: Diagnosis not present

## 2019-01-08 DIAGNOSIS — D1801 Hemangioma of skin and subcutaneous tissue: Secondary | ICD-10-CM | POA: Diagnosis not present

## 2019-01-08 DIAGNOSIS — L814 Other melanin hyperpigmentation: Secondary | ICD-10-CM | POA: Diagnosis not present

## 2019-05-04 ENCOUNTER — Other Ambulatory Visit: Payer: Self-pay

## 2019-05-07 DIAGNOSIS — H04123 Dry eye syndrome of bilateral lacrimal glands: Secondary | ICD-10-CM | POA: Diagnosis not present

## 2019-05-07 DIAGNOSIS — H2513 Age-related nuclear cataract, bilateral: Secondary | ICD-10-CM | POA: Diagnosis not present

## 2019-05-07 DIAGNOSIS — G510 Bell's palsy: Secondary | ICD-10-CM | POA: Diagnosis not present

## 2019-05-07 DIAGNOSIS — Z83511 Family history of glaucoma: Secondary | ICD-10-CM | POA: Diagnosis not present

## 2019-05-07 NOTE — Progress Notes (Deleted)
53 y.o. G0P0000 Single  {Race/ethnicity:17218} Fe here for annual exam.    Patient's last menstrual period was 10/04/2015.          Sexually active: {yes no:314532}  The current method of family planning is post menopausal status.    Exercising: {yes no:314532}  {types:19826} Smoker:  {YES NO:22349}  ROS  Health Maintenance: Pap:  11-05-15 neg HPV HR neg History of Abnormal Pap: {YES NO:22349} MMG:  07-03-18 category b density birads 1:neg Self Breast exams: {YES NO:22349} Colonoscopy:  None, did cologard 2018-neg BMD:   none TDaP:  2013 Shingles: no Pneumonia: no Hep C and HIV: not done Labs: ***   reports that she has never smoked. She has never used smokeless tobacco. She reports that she does not drink alcohol or use drugs.  Past Medical History:  Diagnosis Date  . Environmental and seasonal allergies   . Hx of varicella   . Hypercholesterolemia 12/13   on medication  . Recurrent sinusitis    neg ct 2013   . Skin cancer of face    cryotherapy done fall 2015    Past Surgical History:  Procedure Laterality Date  . SHOULDER SURGERY Left 03/26/2008   torn labrum 3 pins  . URETHRA SURGERY  07/26/1970    Current Outpatient Medications  Medication Sig Dispense Refill  . Multiple Vitamins-Minerals (MULTIVITAMIN PO) Take by mouth.     No current facility-administered medications for this visit.     Family History  Problem Relation Age of Onset  . Hypertension Father   . Gout Father   . Healthy Father        age 86  . Heart disease Mother   . Stroke Mother        in her 17s   . Migraines Mother   . Healthy Brother        2    ROS:  Pertinent items are noted in HPI.  Otherwise, a comprehensive ROS was negative.  Exam:   LMP 10/04/2015 Comment: last spotting was 1/17   Ht Readings from Last 3 Encounters:  08/14/18 5' 9.5" (1.765 m)  12/20/17 5' 10.25" (1.784 m)  03/14/17 5' 10.25" (1.784 m)    General appearance: alert, cooperative and appears stated  age Head: Normocephalic, without obvious abnormality, atraumatic Neck: no adenopathy, supple, symmetrical, trachea midline and thyroid {EXAM; THYROID:18604} Lungs: clear to auscultation bilaterally Breasts: {Exam; breast:13139::"normal appearance, no masses or tenderness"} Heart: regular rate and rhythm Abdomen: soft, non-tender; no masses,  no organomegaly Extremities: extremities normal, atraumatic, no cyanosis or edema Skin: Skin color, texture, turgor normal. No rashes or lesions Lymph nodes: Cervical, supraclavicular, and axillary nodes normal. No abnormal inguinal nodes palpated Neurologic: Grossly normal   Pelvic: External genitalia:  no lesions              Urethra:  normal appearing urethra with no masses, tenderness or lesions              Bartholin's and Skene's: normal                 Vagina: normal appearing vagina with normal color and discharge, no lesions              Cervix: {exam; cervix:14595}              Pap taken: {yes no:314532} Bimanual Exam:  Uterus:  {exam; uterus:12215}              Adnexa: {exam; adnexa:12223}  Rectovaginal: Confirms               Anus:  normal sphincter tone, no lesions  Chaperone present: ***  A:  Well Woman with normal exam  P:   Reviewed health and wellness pertinent to exam  Pap smear: {YES NO:22349}  {plan; gyn:5269::"mammogram","pap smear","return annually or prn"}  An After Visit Summary was printed and given to the patient.

## 2019-05-08 ENCOUNTER — Encounter: Payer: Self-pay | Admitting: Certified Nurse Midwife

## 2019-05-08 ENCOUNTER — Ambulatory Visit: Payer: Federal, State, Local not specified - PPO | Admitting: Certified Nurse Midwife

## 2019-05-08 ENCOUNTER — Other Ambulatory Visit (HOSPITAL_COMMUNITY)
Admission: RE | Admit: 2019-05-08 | Discharge: 2019-05-08 | Disposition: A | Discharge status: Home / Self Care | Payer: Federal, State, Local not specified - PPO | Source: Ambulatory Visit | Attending: Certified Nurse Midwife | Admitting: Certified Nurse Midwife

## 2019-05-08 ENCOUNTER — Other Ambulatory Visit: Payer: Self-pay

## 2019-05-08 VITALS — BP 122/80 | HR 76 | Temp 96.9°F | Ht 70.25 in | Wt 205.8 lb

## 2019-05-08 DIAGNOSIS — E559 Vitamin D deficiency, unspecified: Secondary | ICD-10-CM

## 2019-05-08 DIAGNOSIS — Z0000 Encounter for general adult medical examination without abnormal findings: Secondary | ICD-10-CM | POA: Diagnosis not present

## 2019-05-08 DIAGNOSIS — Z23 Encounter for immunization: Secondary | ICD-10-CM

## 2019-05-08 DIAGNOSIS — Z124 Encounter for screening for malignant neoplasm of cervix: Secondary | ICD-10-CM | POA: Insufficient documentation

## 2019-05-08 DIAGNOSIS — Z01419 Encounter for gynecological examination (general) (routine) without abnormal findings: Secondary | ICD-10-CM | POA: Diagnosis not present

## 2019-05-08 DIAGNOSIS — E118 Type 2 diabetes mellitus with unspecified complications: Secondary | ICD-10-CM | POA: Diagnosis not present

## 2019-05-08 DIAGNOSIS — Z1211 Encounter for screening for malignant neoplasm of colon: Secondary | ICD-10-CM

## 2019-05-08 DIAGNOSIS — R739 Hyperglycemia, unspecified: Secondary | ICD-10-CM | POA: Diagnosis not present

## 2019-05-08 NOTE — Progress Notes (Signed)
53 y.o. G0P0000 Single  Caucasian Fe here for annual exam. Menopausal denies vaginal bleeding or vaginal dryness. Seeing Dr. Regis Bill for aex. Prefers labs here today. Will see Dr. Regis Bill if needed or concerns. Had Bell's Palsy in past year and treated with medication, all "good now". Still working in [place], [place]., may plan to retire next year. Plans to do colonoscopy soon, requests referral. Requests Flu vaccine today if possible. No other health issues today.  Patient's last menstrual period was 10/04/2015.          Sexually active: No.  The current method of family planning is post menopausal status.    Exercising: No.   Smoker:  no  Review of Systems  Constitutional: Negative.   HENT: Negative.   Eyes: Negative.   Respiratory: Negative.   Cardiovascular: Negative.   Gastrointestinal: Negative.   Genitourinary: Negative.   Musculoskeletal: Negative.   Skin: Negative.   Neurological: Negative.   Endo/Heme/Allergies: Negative.   Psychiatric/Behavioral: Negative.   All other systems reviewed and are negative.   Health Maintenance: Pap:  11/05/15 Neg. HR HPV:neg   10/15/13 Neg  History of Abnormal Pap: no MMG:  07/03/18 BIRADS1:neg  Self Breast exams: yes Colonoscopy:  Cologuard 04/26/17 Neg. BMD:   Never TDaP:  2013  Shingles: No Pneumonia: No Flu vacc: here today Hep C and HIV: No Labs: PCP   reports that she has never smoked. She has never used smokeless tobacco. She reports that she does not drink alcohol or use drugs.  Past Medical History:  Diagnosis Date  . Bell's palsy 12/2018  . Environmental and seasonal allergies   . Hx of varicella   . Hypercholesterolemia 12/13   on medication  . Recurrent sinusitis    neg ct 2013   . Skin cancer of face    cryotherapy done fall 2015  . Vertigo     Past Surgical History:  Procedure Laterality Date  . SHOULDER SURGERY Left 03/26/2008   torn labrum 3 pins  . URETHRA SURGERY  07/26/1970    Current Outpatient Medications   Medication Sig Dispense Refill  . cetirizine (ZYRTEC) 10 MG tablet Take 1 tablet by mouth daily as needed.    . Multiple Vitamins-Minerals (MULTIVITAMIN PO) Take by mouth.     No current facility-administered medications for this visit.     Family History  Problem Relation Age of Onset  . Hypertension Father   . Gout Father   . Healthy Father        age 30  . Heart disease Mother   . Stroke Mother        in her 49s   . Migraines Mother   . Healthy Brother        2    ROS:  Pertinent items are noted in HPI.  Otherwise, a comprehensive ROS was negative.  Exam:   BP 122/80   Pulse 76   Temp (!) 96.9 F (36.1 C) (Temporal)   Ht 5' 10.25" (1.784 m)   Wt 205 lb 12.8 oz (93.4 kg)   LMP 10/04/2015 Comment: last spotting was 1/17  BMI 29.32 kg/m  Height: 5' 10.25" (178.4 cm) Ht Readings from Last 3 Encounters:  05/08/19 5' 10.25" (1.784 m)  08/14/18 5' 9.5" (1.765 m)  12/20/17 5' 10.25" (1.784 m)    General appearance: alert, cooperative and appears stated age Head: Normocephalic, without obvious abnormality, atraumatic Neck: no adenopathy, supple, symmetrical, trachea midline and thyroid normal to inspection and palpation Lungs: clear to  auscultation bilaterally Breasts: normal appearance, no masses or tenderness, No nipple retraction or dimpling, No nipple discharge or bleeding, No axillary or supraclavicular adenopathy Heart: regular rate and rhythm Abdomen: soft, non-tender; no masses,  no organomegaly Extremities: extremities normal, atraumatic, no cyanosis or edema Skin: Skin color, texture, turgor normal. No rashes or lesions Lymph nodes: Cervical, supraclavicular, and axillary nodes normal. No abnormal inguinal nodes palpated Neurologic: Grossly normal   Pelvic: External genitalia:  no lesions              Urethra:  normal appearing urethra with no masses, tenderness or lesions              Bartholin's and Skene's: normal                 Vagina: normal  appearing vagina with normal color and discharge, no lesions              Cervix: no cervical motion tenderness, no lesions, nulliparous appearance and retroverted              Pap taken: Yes.   Bimanual Exam:  Uterus:  normal size, contour, position, consistency, mobility, non-tender and anteverted              Adnexa: normal adnexa and no mass, fullness, tenderness               Rectovaginal: Confirms               Anus:  normal sphincter tone, no lesions  Chaperone present: yes  A:  Well Woman with normal exam  Menopausal no HRT  History of Bell's Palsy in past year, no sequale   Immunization due  Flu vaccine  Colonoscopy due  Screening labs    P:   Reviewed health and wellness pertinent to exam  Aware of need to advise if vaginal bleeding  Requests Flu vaccine  Patient requests referral, she will be called with information  Labs: Lipid panel, CMP, TSH, CBC, Hgb A1-C, Vitamin D  Pap smear: yes   counseled on breast self exam, mammography screening, feminine hygiene, menopause, adequate intake of calcium and vitamin D, diet and exercise  return annually or prn  An After Visit Summary was printed and given to the patient.

## 2019-05-08 NOTE — Patient Instructions (Addendum)

## 2019-05-09 LAB — COMPREHENSIVE METABOLIC PANEL
ALT: 35 IU/L — ABNORMAL HIGH (ref 0–32)
AST: 24 IU/L (ref 0–40)
Albumin/Globulin Ratio: 1.5 (ref 1.2–2.2)
Albumin: 4.6 g/dL (ref 3.8–4.9)
Alkaline Phosphatase: 112 IU/L (ref 39–117)
BUN/Creatinine Ratio: 21 (ref 9–23)
BUN: 18 mg/dL (ref 6–24)
Bilirubin Total: 0.3 mg/dL (ref 0.0–1.2)
CO2: 25 mmol/L (ref 20–29)
Calcium: 10.4 mg/dL — ABNORMAL HIGH (ref 8.7–10.2)
Chloride: 99 mmol/L (ref 96–106)
Creatinine, Ser: 0.86 mg/dL (ref 0.57–1.00)
GFR calc Af Amer: 90 mL/min/{1.73_m2} (ref >59)
GFR calc non Af Amer: 78 mL/min/{1.73_m2} (ref >59)
Globulin, Total: 3 g/dL (ref 1.5–4.5)
Glucose: 87 mg/dL (ref 65–99)
Potassium: 4.2 mmol/L (ref 3.5–5.2)
Sodium: 140 mmol/L (ref 134–144)
Total Protein: 7.6 g/dL (ref 6.0–8.5)

## 2019-05-09 LAB — LIPID PANEL
Chol/HDL Ratio: 5.8 ratio — ABNORMAL HIGH (ref 0.0–4.4)
Cholesterol, Total: 280 mg/dL — ABNORMAL HIGH (ref 100–199)
HDL: 48 mg/dL (ref >39)
LDL Chol Calc (NIH): 194 mg/dL — ABNORMAL HIGH (ref 0–99)
Triglycerides: 201 mg/dL — ABNORMAL HIGH (ref 0–149)
VLDL Cholesterol Cal: 38 mg/dL (ref 5–40)

## 2019-05-09 LAB — CBC
Hematocrit: 42.5 % (ref 34.0–46.6)
Hemoglobin: 14.5 g/dL (ref 11.1–15.9)
MCH: 32.2 pg (ref 26.6–33.0)
MCHC: 34.1 g/dL (ref 31.5–35.7)
MCV: 94 fL (ref 79–97)
Platelets: 241 10*3/uL (ref 150–450)
RBC: 4.51 x10E6/uL (ref 3.77–5.28)
RDW: 12.5 % (ref 11.7–15.4)
WBC: 7 10*3/uL (ref 3.4–10.8)

## 2019-05-09 LAB — VITAMIN D 25 HYDROXY (VIT D DEFICIENCY, FRACTURES): Vit D, 25-Hydroxy: 22.1 ng/mL — ABNORMAL LOW (ref 30.0–100.0)

## 2019-05-09 LAB — TSH: TSH: 3.08 u[IU]/mL (ref 0.450–4.500)

## 2019-05-09 LAB — HEMOGLOBIN A1C
Est. average glucose Bld gHb Est-mCnc: 108 mg/dL
Hgb A1c MFr Bld: 5.4 % (ref 4.8–5.6)

## 2019-05-10 LAB — CYTOLOGY - PAP: Diagnosis: NEGATIVE

## 2019-05-24 NOTE — Progress Notes (Signed)
Virtual Visit via Video Note  I connected with@ on [id number] at  1:30 PM EDT by a video enabled telemedicine application and verified that I am speaking with the correct person using two identifiers. Location patient: home Location provider:work   office Persons participating in the virtual visit: patient, provider  [place] national recommendations  regarding COVID 19 pandemic   video visit is advised over in office visit for this patient.  Patient aware  of the limitations of evaluation and management by telemedicine and  availability of in person appointments. and agreed to proceed.   HPI: Olivia Hill presents for video visit  Fu labs done by gyne   She is still in [place] to retire in 9 months  Doing fine at  Shut down working from home .  Develop dbells palsy thi summer and wigh vertigo  [facility] is better  Some ? Of minor abn on  ekg ? pulm asthma changes?   Last PV with me and visit  Was [id number] Has seen GYNE  And vit d deficiency  Noted to inc to 2000 iu per day  Planned colonoscopy with dr Collene Mares ROS: See pertinent positives and negatives per HPI. No cp sob   Not exercising as much out of inertia but prefers to try  Diet and e before beginning med  fam hx father MI age 15    Past Medical History:  Diagnosis Date  . Bell's palsy 12/2018  . Environmental and seasonal allergies   . Hx of varicella   . Hypercholesterolemia 12/13   on medication  . Recurrent sinusitis    neg ct 2013   . Skin cancer of face    cryotherapy done fall 2015  . Vertigo     Past Surgical History:  Procedure Laterality Date  . SHOULDER SURGERY Left 03/26/2008   torn labrum 3 pins  . URETHRA SURGERY  07/26/1970    Family History  Problem Relation Age of Onset  . Hypertension Father   . Gout Father   . Healthy Father        age 5  . Heart disease Mother   . Stroke Mother        in her 37s   . Migraines Mother   . Healthy Brother        2    Social History   Tobacco Use  . Smoking  status: Never Smoker  . Smokeless tobacco: Never Used  Substance Use Topics  . Alcohol use: No  . Drug use: No      Current Outpatient Medications:  .  cetirizine (ZYRTEC) 10 MG tablet, Take 1 tablet by mouth daily as needed., Disp: , Rfl:  .  Multiple Vitamins-Minerals (MULTIVITAMIN PO), Take by mouth., Disp: , Rfl:   EXAM: BP Readings from Last 3 Encounters:  05/08/19 122/80  08/14/18 128/84  12/20/17 110/70   Wt Readings from Last 3 Encounters:  05/08/19 205 lb 12.8 oz (93.4 kg)  08/14/18 203 lb 4.8 oz (92.2 kg)  12/20/17 203 lb (92.1 kg)    VITALS per patient if applicable: reported normal   GENERAL: alert, oriented, appears well and in no acute distress  HEENT: atraumatic, conjunttiva clear, no obvious abnormalities on inspection of external nose and ears NECK: normal movements of the head and neck  LUNGS: on inspection no signs of respiratory distress, breathing rate appears normal, no obvious gross SOB, gasping or wheezing  CV: no obvious cyanosis  PSYCH/NEURO: pleasant and cooperative, no obvious  depression or anxiety, speech and thought processing grossly intact Lab Results  Component Value Date   WBC 7.0 05/08/2019   HGB 14.5 05/08/2019   HCT 42.5 05/08/2019   PLT 241 05/08/2019   GLUCOSE 87 05/08/2019   CHOL 280 (H) 05/08/2019   TRIG 201 (H) 05/08/2019   HDL 48 05/08/2019   LDLDIRECT 184.0 08/14/2018   LDLCALC 194 (H) 05/08/2019   ALT 35 (H) 05/08/2019   AST 24 05/08/2019   NA 140 05/08/2019   K 4.2 05/08/2019   CL 99 05/08/2019   CREATININE 0.86 05/08/2019   BUN 18 05/08/2019   CO2 25 05/08/2019   TSH 3.080 05/08/2019   HGBA1C 5.4 05/08/2019   The 10-year ASCVD risk score Mikey Bussing DC Jr., et al., 2013) is: 2.4%   Values used to calculate the score:     Age: 45 years     Sex: Female     Is Non-Hispanic African American: No     Diabetic: No     Tobacco smoker: No     Systolic Blood Pressure: 123XX123 mmHg     Is BP treated: No     HDL  Cholesterol: 48 mg/dL     Total Cholesterol: 280 mg/dL  ASSESSMENT AND PLAN:  Discussed the following assessment and plan:    ICD-10-CM   1. Hyperlipidemia, unspecified hyperlipidemia type  E78.5 Lipid panel    Hepatic function panel    Basic metabolic panel    VITAMIN D 25 Hydroxy (Vit-D Deficiency, Fractures)  2. Vitamin D deficiency  E55.9 Lipid panel    Hepatic function panel    Basic metabolic panel    VITAMIN D 25 Hydroxy (Vit-D Deficiency, Fractures)  3. Serum calcium elevated  E83.52 Lipid panel    Hepatic function panel    Basic metabolic panel    VITAMIN D 25 Hydroxy (Vit-D Deficiency, Fractures)   borderline  plan repeat hydrated   Calcium 10.4  Vit d 22  Alt 35   Now on vit d 2000 per day  Pt wants to go with  Diet and  exercise changes .   And fu  In lieu of medication  Fa had mi at age  82  Disc guidelines of    Benefit / risk of medications. Plan  Fasting lab lipid lfts and vit d  Bmp  in about 3 mos after intervention and if not successful consider adding statin  meds   Counseled.  To get colonoscopy will get copy EKG to [place]    Expectant management and discussion of plan and treatment with opportunity to ask questions and all were answered. The patient agreed with the plan and demonstrated an understanding of the instructions.   Advised to call back or seek an in-person evaluation if worsening  or having  further concerns .   Shanon Ace, MD

## 2019-05-25 ENCOUNTER — Telehealth (INDEPENDENT_AMBULATORY_CARE_PROVIDER_SITE_OTHER): Payer: Federal, State, Local not specified - PPO | Admitting: Internal Medicine

## 2019-05-25 ENCOUNTER — Other Ambulatory Visit: Payer: Self-pay

## 2019-05-25 ENCOUNTER — Encounter: Payer: Self-pay | Admitting: Internal Medicine

## 2019-05-25 DIAGNOSIS — E559 Vitamin D deficiency, unspecified: Secondary | ICD-10-CM

## 2019-05-25 DIAGNOSIS — E785 Hyperlipidemia, unspecified: Secondary | ICD-10-CM

## 2019-05-29 DIAGNOSIS — Z1211 Encounter for screening for malignant neoplasm of colon: Secondary | ICD-10-CM | POA: Diagnosis not present

## 2019-07-16 DIAGNOSIS — Z803 Family history of malignant neoplasm of breast: Secondary | ICD-10-CM | POA: Diagnosis not present

## 2019-07-16 DIAGNOSIS — Z1231 Encounter for screening mammogram for malignant neoplasm of breast: Secondary | ICD-10-CM | POA: Diagnosis not present

## 2019-07-16 LAB — HM MAMMOGRAPHY

## 2019-07-24 ENCOUNTER — Encounter: Payer: Self-pay | Admitting: Internal Medicine

## 2019-10-15 ENCOUNTER — Encounter: Payer: Self-pay | Admitting: Certified Nurse Midwife

## 2020-04-30 DIAGNOSIS — S92504A Nondisplaced unspecified fracture of right lesser toe(s), initial encounter for closed fracture: Secondary | ICD-10-CM | POA: Diagnosis not present

## 2020-04-30 DIAGNOSIS — M79671 Pain in right foot: Secondary | ICD-10-CM | POA: Diagnosis not present

## 2020-05-09 ENCOUNTER — Ambulatory Visit: Payer: Federal, State, Local not specified - PPO | Admitting: Certified Nurse Midwife

## 2020-05-23 NOTE — Progress Notes (Deleted)
54 y.o. G0P0000 Single White or Caucasian female here for annual exam.    Patient's last menstrual period was 10/04/2015.          Sexually active: {yes no:314532}  The current method of family planning is post menopausal status.    Exercising: {yes no:314532}  {types:19826} Smoker:  {YES NO:22349}  Health Maintenance: Pap:  11-05-15 neg HPV HR neg, 05-08-2019 neg History of abnormal Pap:  no MMG:  07-16-2019 category b density birads 1:neg Colonoscopy:  None, cologuard neg 2018 BMD:   none TDaP:  2013 Pneumonia vaccine(s):  no Shingrix:   no Hep C testing: not done Screening Labs: ***   reports that she has never smoked. She has never used smokeless tobacco. She reports that she does not drink alcohol and does not use drugs.  Past Medical History:  Diagnosis Date  . Bell's palsy 12/2018  . Environmental and seasonal allergies   . Hx of varicella   . Hypercholesterolemia 12/13   on medication  . Recurrent sinusitis    neg ct 2013   . Skin cancer of face    cryotherapy done fall 2015  . Vertigo     Past Surgical History:  Procedure Laterality Date  . SHOULDER SURGERY Left 03/26/2008   torn labrum 3 pins  . URETHRA SURGERY  07/26/1970    Current Outpatient Medications  Medication Sig Dispense Refill  . cetirizine (ZYRTEC) 10 MG tablet Take 1 tablet by mouth daily as needed.    . Multiple Vitamins-Minerals (MULTIVITAMIN PO) Take by mouth.     No current facility-administered medications for this visit.    Family History  Problem Relation Age of Onset  . Hypertension Father   . Gout Father   . Healthy Father        age 79  . Heart disease Mother   . Stroke Mother        in her 79s   . Migraines Mother   . Healthy Brother        2    Review of Systems  Exam:   LMP 10/04/2015 Comment: last spotting was 1/17     General appearance: alert, cooperative and appears stated age Head: Normocephalic, without obvious abnormality, atraumatic Neck: no adenopathy,  supple, symmetrical, trachea midline and thyroid {EXAM; THYROID:18604} Lungs: clear to auscultation bilaterally Breasts: {Exam; breast:13139::"normal appearance, no masses or tenderness"} Heart: regular rate and rhythm Abdomen: soft, non-tender; bowel sounds normal; no masses,  no organomegaly Extremities: extremities normal, atraumatic, no cyanosis or edema Skin: Skin color, texture, turgor normal. No rashes or lesions Lymph nodes: Cervical, supraclavicular, and axillary nodes normal. No abnormal inguinal nodes palpated Neurologic: Grossly normal   Pelvic: External genitalia:  no lesions              Urethra:  normal appearing urethra with no masses, tenderness or lesions              Bartholins and Skenes: normal                 Vagina: normal appearing vagina with normal color and discharge, no lesions              Cervix: {exam; cervix:14595}              Pap taken: {yes no:314532} Bimanual Exam:  Uterus:  {exam; uterus:12215}              Adnexa: {exam; adnexa:12223}  Rectovaginal: Confirms               Anus:  normal sphincter tone, no lesions  Chaperone, ***Terence Lux, CMA, was present for exam.  A:  Well Woman with normal exam  P:   {plan; gyn:5269::"mammogram","pap smear","return annually or prn"}

## 2020-05-27 ENCOUNTER — Ambulatory Visit: Payer: Federal, State, Local not specified - PPO | Admitting: Nurse Practitioner

## 2020-07-02 DIAGNOSIS — H04123 Dry eye syndrome of bilateral lacrimal glands: Secondary | ICD-10-CM | POA: Diagnosis not present

## 2020-07-02 DIAGNOSIS — H2513 Age-related nuclear cataract, bilateral: Secondary | ICD-10-CM | POA: Diagnosis not present

## 2020-07-02 DIAGNOSIS — H40013 Open angle with borderline findings, low risk, bilateral: Secondary | ICD-10-CM | POA: Diagnosis not present

## 2020-07-02 DIAGNOSIS — Z83511 Family history of glaucoma: Secondary | ICD-10-CM | POA: Diagnosis not present

## 2020-07-02 DIAGNOSIS — G510 Bell's palsy: Secondary | ICD-10-CM | POA: Diagnosis not present

## 2020-07-04 ENCOUNTER — Ambulatory Visit: Payer: Federal, State, Local not specified - PPO | Admitting: Obstetrics and Gynecology

## 2020-09-21 DIAGNOSIS — J40 Bronchitis, not specified as acute or chronic: Secondary | ICD-10-CM | POA: Diagnosis not present

## 2020-10-07 NOTE — Progress Notes (Signed)
Chief Complaint  Patient presents with  . Annual Exam    HPI: Patient  Olivia Hill  55 y.o. comes in today for Alligator visit  Has moved back to this area currently living with parents pending separate individual arrangement. She is retired from [employer]. Raquel Sarna history Father diabetes mom had stroke heart disease at an earlier age but is living into her 67s at this time Health Maintenance  Topic Date Due  . COVID-19 Vaccine (3 - Booster for Pfizer series) 04/25/2020  . Fecal DNA (Cologuard)  04/26/2020  . Hepatitis C Screening  10/08/2021 (Originally Apr 04, 1966)  . HIV Screening  10/08/2021 (Originally 06/22/1981)  . MAMMOGRAM  07/15/2021  . TETANUS/TDAP  07/26/2021  . PAP SMEAR-Modifier  05/07/2022  . INFLUENZA VACCINE  Completed  . HPV VACCINES  Aged Out   Health Maintenance Review LIFESTYLE:  Exercise: Not currently has a plan taken up golf Tobacco/ETS: No Alcohol: No Sugar beverages: Not recently Sleep: Erupted because of living arrangement and cats.  Usually about 6 to 8 hours Drug use: no HH of 3 with cats Work: Currently retired    ROS:  GEN/ HEENT: No fever, significant weight changes sweats headaches vision problems hearing changes, CV/ PULM; No chest pain shortness of breath cough, syncope,edema  change in exercise tolerance. GI /GU: No adominal pain, vomiting, change in bowel habits. No blood in the stool. No significant GU symptoms. SKIN/HEME: ,no acute skin rashes suspicious lesions or bleeding. No lymphadenopathy, nodules, masses.  NEURO/ PSYCH:  No neurologic signs such as weakness numbness. No depression anxiety. IMM/ Allergy: No unusual infections.  Allergy .   REST of 12 system review negative except as per HPI   Past Medical History:  Diagnosis Date  . Bell's palsy 12/2018  . Environmental and seasonal allergies   . Hx of varicella   . Hypercholesterolemia 12/13   on medication  . Recurrent sinusitis    neg ct  2013   . Skin cancer of face    cryotherapy done fall 2015  . Vertigo     Past Surgical History:  Procedure Laterality Date  . SHOULDER SURGERY Left 03/26/2008   torn labrum 3 pins  . URETHRA SURGERY  07/26/1970    Family History  Problem Relation Age of Onset  . Hypertension Father   . Gout Father   . Healthy Father        age 20  . Heart disease Mother   . Stroke Mother        in her 64s   . Migraines Mother   . Healthy Brother        2    Social History   Socioeconomic History  . Marital status: Single    Spouse name: Not on file  . Number of children: Not on file  . Years of education: Not on file  . Highest education level: Not on file  Occupational History  . Not on file  Tobacco Use  . Smoking status: Never Smoker  . Smokeless tobacco: Never Used  Vaping Use  . Vaping Use: Never used  Substance and Sexual Activity  . Alcohol use: No  . Drug use: No  . Sexual activity: Not Currently    Partners: Male    Birth control/protection: Post-menopausal  Other Topics Concern  . 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 cetirizine (ZYRTEC) 10 MG tablet Take 1 tablet by mouth daily as needed.    . Multiple Vitamins-Minerals (MULTIVITAMIN PO) Take by mouth.     No facility-administered medications prior to visit.     EXAM:  BP 118/82 (BP Location: Left Arm, Patient Position: Sitting, Cuff Size: Large)   Pulse 77   Temp 98.1 F (36.7 C) (Oral)   Ht 5\' 11"  (1.803 m)   Wt  207 lb (93.9 kg)   LMP 10/04/2015 Comment: last spotting was 1/17  SpO2 97%   BMI 28.87 kg/m   Body mass index is 28.87 kg/m. Wt Readings from Last 3 Encounters:  10/08/20 207 lb (93.9 kg)  05/08/19 205 lb 12.8 oz (93.4 kg)  08/14/18 203 lb 4.8 oz (92.2 kg)    Physical Exam: Vital signs reviewed PNT:IRWE is a well-developed well-nourished alert cooperative    who appearsr stated age in no acute distress.  HEENT: normocephalic atraumatic , Eyes: PERRL EOM's full, conjunctiva clear, Nares: paten,t no deformity discharge or tenderness., Ears: no deformity EAC's clear TMs with normal landmarks. Mouth: Masked NECK: supple without masses, thyromegaly or bruits. CHEST/PULM:  Clear to auscultation and percussion breath sounds equal no wheeze , rales or rhonchi. No chest wall deformities or tenderness. Breast: normal by inspection . No dimpling, discharge, masses, tenderness or discharge . CV: PMI is nondisplaced, S1 S2 no gallops, murmurs, rubs. Peripheral pulses are full without delay.No JVD .  ABDOMEN: Bowel sounds normal nontender  No guard or rebound, no hepato splenomegal no CVA tenderness.  No hernia. Extremtities:  No clubbing cyanosis or edema, no acute joint swelling or redness no focal atrophy NEURO:  Oriented x3, cranial nerves 3-12 appear to be intact, no obvious focal weakness,gait within normal limits no abnormal reflexes or asymmetrical SKIN: No acute rashes normal turgor, color, no bruising or petechiae. PSYCH: Oriented, good eye contact, no obvious depression anxiety, cognition and judgment appear normal. LN: no cervical axillary inguinal adenopathy  Lab Results  Component Value Date   WBC 7.0 05/08/2019   HGB 14.5 05/08/2019   HCT 42.5 05/08/2019   PLT 241 05/08/2019   GLUCOSE 87 05/08/2019   CHOL 280 (H) 05/08/2019   TRIG 201 (H) 05/08/2019   HDL 48 05/08/2019   LDLDIRECT 184.0 08/14/2018   LDLCALC 194 (H) 05/08/2019   ALT 35 (H) 05/08/2019   AST 24 05/08/2019    NA 140 05/08/2019   K 4.2 05/08/2019   CL 99 05/08/2019   CREATININE 0.86 05/08/2019   BUN 18 05/08/2019   CO2 25 05/08/2019   TSH 3.080 05/08/2019   HGBA1C 5.4 05/08/2019    BP Readings from Last 3 Encounters:  10/08/20 118/82  05/08/19 122/80  08/14/18 128/84    Lab plan eviewed with patient   ASSESSMENT AND PLAN:  Discussed the following assessment and plan:    ICD-10-CM   1. Visit for preventive health examination  R15.40 Basic metabolic panel    CBC with Differential/Platelet    Hepatic function panel  Lipid panel    TSH    Hemoglobin A1c    Hemoglobin X3A    Basic metabolic panel    CBC with Differential/Platelet    TSH    Hepatic function panel    Lipid panel  2. Hyperlipidemia, unspecified hyperlipidemia type  T55.7 Basic metabolic panel    CBC with Differential/Platelet    Hepatic function panel    Lipid panel    TSH    Hemoglobin A1c    Hemoglobin D2K    Basic metabolic panel    CBC with Differential/Platelet    TSH    Hepatic function panel    Lipid panel   Return in about 1 year (around 10/08/2021) for depending on results. For Cologuard Shingrix today #1 Lab pending discussed potential of statin intervention in addition to lifestyle or even considering coronary calcium scan to assess risk. She will be starting a weight loss program soon also. Patient Care Team: Uriel Horkey, Standley Brooking, MD as PCP - General (Internal Medicine) Gean Quint, MD (Inactive) as Consulting Physician (Allergy and Immunology) Regina Eck, CNM as Referring Physician (Certified Nurse Midwife) Danella Sensing, MD as Consulting Physician (Dermatology) sandra fuller dds  Koleen Nimrod Maxine Glenn, MD as Referring Physician (Internal Medicine) Patient Instructions   shingrix today   Repeat in about 6 months  Can make appt for injection only.   Continue attention  To lifestyle intervention healthy eating and exercise .   Will notify you  of labs when available.   Due  for cologuard   To be ordered .     Health Maintenance, Female Adopting a healthy lifestyle and getting preventive care are important in promoting health and wellness. Ask your health care provider about:  The right schedule for you to have regular tests and exams.  Things you can do on your own to prevent diseases and keep yourself healthy. What should I know about diet, weight, and exercise? Eat a healthy diet  Eat a diet that includes plenty of vegetables, fruits, low-fat dairy products, and lean protein.  Do not eat a lot of foods that are high in solid fats, added sugars, or sodium.   Maintain a healthy weight Body mass index (BMI) is used to identify weight problems. It estimates body fat based on height and weight. Your health care provider can help determine your BMI and help you achieve or maintain a healthy weight. Get regular exercise Get regular exercise. This is one of the most important things you can do for your health. Most adults should:  Exercise for at least 150 minutes each week. The exercise should increase your heart rate and make you sweat (moderate-intensity exercise).  Do strengthening exercises at least twice a week. This is in addition to the moderate-intensity exercise.  Spend less time sitting. Even light physical activity can be beneficial. Watch cholesterol and blood lipids Have your blood tested for lipids and cholesterol at 55 years of age, then have this test every 5 years. Have your cholesterol levels checked more often if:  Your lipid or cholesterol levels are high.  You are older than 55 years of age.  You are at high risk for heart disease. What should I know about cancer screening? Depending on your health history and family history, you may need to have cancer screening at various ages. This may include screening for:  Breast cancer.  Cervical cancer.  Colorectal cancer.  Skin cancer.  Lung cancer. What should I know about heart  disease,  diabetes, and high blood pressure? Blood pressure and heart disease  High blood pressure causes heart disease and increases the risk of stroke. This is more likely to develop in people who have high blood pressure readings, are of African descent, or are overweight.  Have your blood pressure checked: ? Every 3-5 years if you are 34-85 years of age. ? Every year if you are 65 years old or older. Diabetes Have regular diabetes screenings. This checks your fasting blood sugar level. Have the screening done:  Once every three years after age 56 if you are at a normal weight and have a low risk for diabetes.  More often and at a younger age if you are overweight or have a high risk for diabetes. What should I know about preventing infection? Hepatitis B If you have a higher risk for hepatitis B, you should be screened for this virus. Talk with your health care provider to find out if you are at risk for hepatitis B infection. Hepatitis C Testing is recommended for:  Everyone born from [birth year] through 1965.  Anyone with known risk factors for hepatitis C. Sexually transmitted infections (STIs)  Get screened for STIs, including gonorrhea and chlamydia, if: ? You are sexually active and are younger than 55 years of age. ? You are older than 55 years of age and your health care provider tells you that you are at risk for this type of infection. ? Your sexual activity has changed since you were last screened, and you are at increased risk for chlamydia or gonorrhea. Ask your health care provider if you are at risk.  Ask your health care provider about whether you are at high risk for HIV. Your health care provider may recommend a prescription medicine to help prevent HIV infection. If you choose to take medicine to prevent HIV, you should first get tested for HIV. You should then be tested every 3 months for as long as you are taking the medicine. Pregnancy  If you are about to stop  having your period (premenopausal) and you may become pregnant, seek counseling before you get pregnant.  Take 400 to 800 micrograms (mcg) of folic acid every day if you become pregnant.  Ask for birth control (contraception) if you want to prevent pregnancy. Osteoporosis and menopause Osteoporosis is a disease in which the bones lose minerals and strength with aging. This can result in bone fractures. If you are 42 years old or older, or if you are at risk for osteoporosis and fractures, ask your health care provider if you should:  Be screened for bone loss.  Take a calcium or vitamin D supplement to lower your risk of fractures.  Be given hormone replacement therapy (HRT) to treat symptoms of menopause. Follow these instructions at home: Lifestyle  Do not use any products that contain nicotine or tobacco, such as cigarettes, e-cigarettes, and chewing tobacco. If you need help quitting, ask your health care provider.  Do not use street drugs.  Do not share needles.  Ask your health care provider for help if you need support or information about quitting drugs. Alcohol use  Do not drink alcohol if: ? Your health care provider tells you not to drink. ? You are pregnant, may be pregnant, or are planning to become pregnant.  If you drink alcohol: ? Limit how much you use to 0-1 drink a day. ? Limit intake if you are breastfeeding.  Be aware of how much alcohol is in your drink.  In the [place] one drink equals one 12 oz bottle of beer (355 mL), one 5 oz glass of wine (148 mL), or one 1 oz glass of hard liquor (44 mL). General instructions  Schedule regular health, dental, and eye exams.  Stay current with your vaccines.  Tell your health care provider if: ? You often feel depressed. ? You have ever been abused or do not feel safe at home. Summary  Adopting a healthy lifestyle and getting preventive care are important in promoting health and wellness.  Follow your health  care provider's instructions about healthy diet, exercising, and getting tested or screened for diseases.  Follow your health care provider's instructions on monitoring your cholesterol and blood pressure. This information is not intended to replace advice given to you by your health care provider. Make sure you discuss any questions you have with your health care provider. Document Revised: 07/05/2018 Document Reviewed: 07/05/2018 Elsevier Patient Education  2021 [place] K. Nikolos Billig M.D.

## 2020-10-08 ENCOUNTER — Ambulatory Visit (INDEPENDENT_AMBULATORY_CARE_PROVIDER_SITE_OTHER): Payer: Federal, State, Local not specified - PPO | Admitting: Internal Medicine

## 2020-10-08 ENCOUNTER — Other Ambulatory Visit: Payer: Self-pay

## 2020-10-08 ENCOUNTER — Encounter: Payer: Self-pay | Admitting: Internal Medicine

## 2020-10-08 VITALS — BP 118/82 | HR 77 | Temp 98.1°F | Ht 71.0 in | Wt 207.0 lb

## 2020-10-08 DIAGNOSIS — Z0000 Encounter for general adult medical examination without abnormal findings: Secondary | ICD-10-CM

## 2020-10-08 DIAGNOSIS — E785 Hyperlipidemia, unspecified: Secondary | ICD-10-CM

## 2020-10-08 DIAGNOSIS — Z1211 Encounter for screening for malignant neoplasm of colon: Secondary | ICD-10-CM

## 2020-10-08 DIAGNOSIS — E782 Mixed hyperlipidemia: Secondary | ICD-10-CM

## 2020-10-08 DIAGNOSIS — Z23 Encounter for immunization: Secondary | ICD-10-CM

## 2020-10-08 DIAGNOSIS — Z1231 Encounter for screening mammogram for malignant neoplasm of breast: Secondary | ICD-10-CM | POA: Diagnosis not present

## 2020-10-08 LAB — BASIC METABOLIC PANEL
BUN: 15 mg/dL (ref 6–23)
CO2: 30 mEq/L (ref 19–32)
Calcium: 10 mg/dL (ref 8.4–10.5)
Chloride: 104 mEq/L (ref 96–112)
Creatinine, Ser: 0.89 mg/dL (ref 0.40–1.20)
GFR: 73.57 mL/min (ref >60.00)
Glucose, Bld: 94 mg/dL (ref 70–99)
Potassium: 4.5 mEq/L (ref 3.5–5.1)
Sodium: 141 mEq/L (ref 135–145)

## 2020-10-08 LAB — LDL CHOLESTEROL, DIRECT: Direct LDL: 181 mg/dL

## 2020-10-08 LAB — LIPID PANEL
Cholesterol: 262 mg/dL — ABNORMAL HIGH (ref 0–200)
HDL: 43.9 mg/dL (ref >39.00)
NonHDL: 218.4
Total CHOL/HDL Ratio: 6
Triglycerides: 215 mg/dL — ABNORMAL HIGH (ref 0.0–149.0)
VLDL: 43 mg/dL — ABNORMAL HIGH (ref 0.0–40.0)

## 2020-10-08 LAB — CBC WITH DIFFERENTIAL/PLATELET
Basophils Absolute: 0 10*3/uL (ref 0.0–0.1)
Basophils Relative: 0.5 % (ref 0.0–3.0)
Eosinophils Absolute: 0.2 10*3/uL (ref 0.0–0.7)
Eosinophils Relative: 3.6 % (ref 0.0–5.0)
HCT: 42.6 % (ref 36.0–46.0)
Hemoglobin: 14.4 g/dL (ref 12.0–15.0)
Lymphocytes Relative: 44.7 % (ref 12.0–46.0)
Lymphs Abs: 2.9 10*3/uL (ref 0.7–4.0)
MCHC: 33.7 g/dL (ref 30.0–36.0)
MCV: 93.9 fl (ref 78.0–100.0)
Monocytes Absolute: 0.5 10*3/uL (ref 0.1–1.0)
Monocytes Relative: 7.5 % (ref 3.0–12.0)
Neutro Abs: 2.8 10*3/uL (ref 1.4–7.7)
Neutrophils Relative %: 43.7 % (ref 43.0–77.0)
Platelets: 260 10*3/uL (ref 150.0–400.0)
RBC: 4.54 Mil/uL (ref 3.87–5.11)
RDW: 13.2 % (ref 11.5–15.5)
WBC: 6.4 10*3/uL (ref 4.0–10.5)

## 2020-10-08 LAB — HEMOGLOBIN A1C: Hgb A1c MFr Bld: 5.7 % (ref 4.6–6.5)

## 2020-10-08 LAB — HEPATIC FUNCTION PANEL
ALT: 35 U/L (ref 0–35)
AST: 21 U/L (ref 0–37)
Albumin: 4.4 g/dL (ref 3.5–5.2)
Alkaline Phosphatase: 92 U/L (ref 39–117)
Bilirubin, Direct: 0.1 mg/dL (ref 0.0–0.3)
Total Bilirubin: 0.5 mg/dL (ref 0.2–1.2)
Total Protein: 7.2 g/dL (ref 6.0–8.3)

## 2020-10-08 LAB — HM MAMMOGRAPHY

## 2020-10-08 LAB — TSH: TSH: 2.83 u[IU]/mL (ref 0.35–4.50)

## 2020-10-08 NOTE — Patient Instructions (Signed)
shingrix today   Repeat in about 6 months  Can make appt for injection only.   Continue attention  To lifestyle intervention healthy eating and exercise .   Will notify you  of labs when available.   Due for cologuard   To be ordered .     Health Maintenance, Female Adopting a healthy lifestyle and getting preventive care are important in promoting health and wellness. Ask your health care provider about:  The right schedule for you to have regular tests and exams.  Things you can do on your own to prevent diseases and keep yourself healthy. What should I know about diet, weight, and exercise? Eat a healthy diet  Eat a diet that includes plenty of vegetables, fruits, low-fat dairy products, and lean protein.  Do not eat a lot of foods that are high in solid fats, added sugars, or sodium.   Maintain a healthy weight Body mass index (BMI) is used to identify weight problems. It estimates body fat based on height and weight. Your health care provider can help determine your BMI and help you achieve or maintain a healthy weight. Get regular exercise Get regular exercise. This is one of the most important things you can do for your health. Most adults should:  Exercise for at least 150 minutes each week. The exercise should increase your heart rate and make you sweat (moderate-intensity exercise).  Do strengthening exercises at least twice a week. This is in addition to the moderate-intensity exercise.  Spend less time sitting. Even light physical activity can be beneficial. Watch cholesterol and blood lipids Have your blood tested for lipids and cholesterol at 55 years of age, then have this test every 5 years. Have your cholesterol levels checked more often if:  Your lipid or cholesterol levels are high.  You are older than 55 years of age.  You are at high risk for heart disease. What should I know about cancer screening? Depending on your health history and family history,  you may need to have cancer screening at various ages. This may include screening for:  Breast cancer.  Cervical cancer.  Colorectal cancer.  Skin cancer.  Lung cancer. What should I know about heart disease, diabetes, and high blood pressure? Blood pressure and heart disease  High blood pressure causes heart disease and increases the risk of stroke. This is more likely to develop in people who have high blood pressure readings, are of African descent, or are overweight.  Have your blood pressure checked: ? Every 3-5 years if you are 23-48 years of age. ? Every year if you are 31 years old or older. Diabetes Have regular diabetes screenings. This checks your fasting blood sugar level. Have the screening done:  Once every three years after age 28 if you are at a normal weight and have a low risk for diabetes.  More often and at a younger age if you are overweight or have a high risk for diabetes. What should I know about preventing infection? Hepatitis B If you have a higher risk for hepatitis B, you should be screened for this virus. Talk with your health care provider to find out if you are at risk for hepatitis B infection. Hepatitis C Testing is recommended for:  Everyone born from [birth year] through 1965.  Anyone with known risk factors for hepatitis C. Sexually transmitted infections (STIs)  Get screened for STIs, including gonorrhea and chlamydia, if: ? You are sexually active and are younger than 55 years  of age. ? You are older than 55 years of age and your health care provider tells you that you are at risk for this type of infection. ? Your sexual activity has changed since you were last screened, and you are at increased risk for chlamydia or gonorrhea. Ask your health care provider if you are at risk.  Ask your health care provider about whether you are at high risk for HIV. Your health care provider may recommend a prescription medicine to help prevent HIV infection.  If you choose to take medicine to prevent HIV, you should first get tested for HIV. You should then be tested every 3 months for as long as you are taking the medicine. Pregnancy  If you are about to stop having your period (premenopausal) and you may become pregnant, seek counseling before you get pregnant.  Take 400 to 800 micrograms (mcg) of folic acid every day if you become pregnant.  Ask for birth control (contraception) if you want to prevent pregnancy. Osteoporosis and menopause Osteoporosis is a disease in which the bones lose minerals and strength with aging. This can result in bone fractures. If you are 45 years old or older, or if you are at risk for osteoporosis and fractures, ask your health care provider if you should:  Be screened for bone loss.  Take a calcium or vitamin D supplement to lower your risk of fractures.  Be given hormone replacement therapy (HRT) to treat symptoms of menopause. Follow these instructions at home: Lifestyle  Do not use any products that contain nicotine or tobacco, such as cigarettes, e-cigarettes, and chewing tobacco. If you need help quitting, ask your health care provider.  Do not use street drugs.  Do not share needles.  Ask your health care provider for help if you need support or information about quitting drugs. Alcohol use  Do not drink alcohol if: ? Your health care provider tells you not to drink. ? You are pregnant, may be pregnant, or are planning to become pregnant.  If you drink alcohol: ? Limit how much you use to 0-1 drink a day. ? Limit intake if you are breastfeeding.  Be aware of how much alcohol is in your drink. In the [place] one drink equals one 12 oz bottle of beer (355 mL), one 5 oz glass of wine (148 mL), or one 1 oz glass of hard liquor (44 mL). General instructions  Schedule regular health, dental, and eye exams.  Stay current with your vaccines.  Tell your health care provider if: ? You often feel  depressed. ? You have ever been abused or do not feel safe at home. Summary  Adopting a healthy lifestyle and getting preventive care are important in promoting health and wellness.  Follow your health care provider's instructions about healthy diet, exercising, and getting tested or screened for diseases.  Follow your health care provider's instructions on monitoring your cholesterol and blood pressure. This information is not intended to replace advice given to you by your health care provider. Make sure you discuss any questions you have with your health care provider. Document Revised: 07/05/2018 Document Reviewed: 07/05/2018 Elsevier Patient Education  2021 [employer].

## 2020-10-08 NOTE — Addendum Note (Signed)
Addended by: Nilda Riggs on: 10/08/2020 05:08 PM   Modules accepted: Orders

## 2020-10-08 NOTE — Addendum Note (Signed)
Addended by: Nilda Riggs on: 10/08/2020 01:50 PM   Modules accepted: Orders

## 2020-10-16 ENCOUNTER — Encounter: Payer: Self-pay | Admitting: Internal Medicine

## 2020-10-21 NOTE — Progress Notes (Signed)
So as you can see  cholesterol up pretty high .  Although 10 year event risk is low  Intensify  lifestyle changes  If y ou agree Begin statin medication  in addition to above ;begin  crestor 10 mg per day disp 90 refill x 1   Consider getting  coronary ct calcium score  ( out of pocket  99$ )to assess   amount of plaque in  your arteries   that can  give more information about future cardiac event risk .     recheck lipid panel in 4 months either way.   The 10-year ASCVD risk score Mikey Bussing DC Brooke Bonito., et al., 2013) is: 2.6%   Values used to calculate the score:     Age: 55 years     Sex: Female     Is Non-Hispanic African American: No     Diabetic: No     Tobacco smoker: No     Systolic Blood Pressure: 131 mmHg     Is BP treated: No     HDL Cholesterol: 43.9 mg/dL     Total Cholesterol: 262 mg/dL

## 2020-10-31 MED ORDER — ROSUVASTATIN CALCIUM 10 MG PO TABS
10.0000 mg | ORAL_TABLET | Freq: Every day | ORAL | 1 refills | Status: DC
Start: 2020-10-31 — End: 2021-01-05

## 2020-10-31 NOTE — Progress Notes (Signed)
Go ahead and send in Crestor 10 mg take one po qd  disp 90 refill x 1 and repeat lab in 4 mos as discussed  Please order te self pay coronary artery calcium scan( 99 self pay ) for her  dx  elevated cholesterol

## 2020-10-31 NOTE — Addendum Note (Signed)
Addended by: Nilda Riggs on: 10/31/2020 03:58 PM   Modules accepted: Orders

## 2020-10-31 NOTE — Addendum Note (Signed)
Addended by: Nilda Riggs on: 10/31/2020 03:52 PM   Modules accepted: Orders

## 2020-11-04 NOTE — Addendum Note (Signed)
Addended by: Nilda Riggs on: 11/04/2020 01:57 PM   Modules accepted: Orders

## 2020-11-18 LAB — COLOGUARD: Cologuard: NEGATIVE

## 2020-11-25 ENCOUNTER — Telehealth: Payer: Self-pay

## 2020-11-25 ENCOUNTER — Encounter: Payer: Self-pay | Admitting: Internal Medicine

## 2020-11-25 LAB — COLOGUARD: COLOGUARD: NEGATIVE

## 2020-11-25 NOTE — Telephone Encounter (Signed)
I attempted to contact the patient in regard to her cologuard results.

## 2020-11-26 ENCOUNTER — Other Ambulatory Visit: Payer: Self-pay

## 2020-11-26 ENCOUNTER — Ambulatory Visit (INDEPENDENT_AMBULATORY_CARE_PROVIDER_SITE_OTHER)
Admission: RE | Admit: 2020-11-26 | Discharge: 2020-11-26 | Disposition: A | Discharge status: Home / Self Care | Payer: Self-pay | Source: Ambulatory Visit | Attending: Internal Medicine | Admitting: Internal Medicine

## 2020-11-26 DIAGNOSIS — E782 Mixed hyperlipidemia: Secondary | ICD-10-CM

## 2020-11-30 NOTE — Progress Notes (Signed)
Good news  calcium score of coronary is 0  low risk   so for now Continue lifestyle intervention healthy eating and exercise . ( see interpretation   risk categories ) no compelling  reason to add a lipid lowering medication. (But if ldl goes up to 190 range then guidelines advise   add statin medication ) Incidental finding  some changes of fatty liver   again   addressed by healthy diet and exercise .

## 2021-01-05 ENCOUNTER — Other Ambulatory Visit: Payer: Self-pay

## 2021-01-05 ENCOUNTER — Ambulatory Visit (HOSPITAL_BASED_OUTPATIENT_CLINIC_OR_DEPARTMENT_OTHER): Payer: Federal, State, Local not specified - PPO | Admitting: Obstetrics & Gynecology

## 2021-01-05 ENCOUNTER — Encounter (HOSPITAL_BASED_OUTPATIENT_CLINIC_OR_DEPARTMENT_OTHER): Payer: Self-pay | Admitting: Obstetrics & Gynecology

## 2021-01-05 ENCOUNTER — Other Ambulatory Visit (HOSPITAL_COMMUNITY)
Admission: RE | Admit: 2021-01-05 | Discharge: 2021-01-05 | Disposition: A | Discharge status: Home / Self Care | Payer: Federal, State, Local not specified - PPO | Source: Ambulatory Visit | Attending: Obstetrics & Gynecology | Admitting: Obstetrics & Gynecology

## 2021-01-05 VITALS — BP 114/64 | HR 71 | Ht 70.0 in | Wt 206.0 lb

## 2021-01-05 DIAGNOSIS — Z124 Encounter for screening for malignant neoplasm of cervix: Secondary | ICD-10-CM | POA: Insufficient documentation

## 2021-01-05 DIAGNOSIS — Z780 Asymptomatic menopausal state: Secondary | ICD-10-CM

## 2021-01-05 DIAGNOSIS — Z01419 Encounter for gynecological examination (general) (routine) without abnormal findings: Secondary | ICD-10-CM

## 2021-01-05 NOTE — Progress Notes (Signed)
55 y.o. G0P0000 Single White or Caucasian female here for annual exam.  Former patient of Debbi Hollice Espy.  No vaginal bleeding for several years.  Never on HRT.  Retired last July.  Has been great.  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  Not really sure what is next for her.  Patient's last menstrual period was 10/04/2015.          Sexually active: No.  The current method of family planning is post menopausal status.    Exercising: No.   Smoker:  no  Health Maintenance: Pap:  2020 without HR HPV, 2017 with HR HPV History of abnormal Pap:  no MMG:  09/2020 Colonoscopy:  cologuard 10/2020 BMD:   not indicated TDaP:  2013 Pneumonia vaccine(s):  not indicated Shingrix:   started this this year Screening Labs: done 2022 with Dr. Regis Bill   reports that she has never smoked. She has never used smokeless tobacco. She reports that she does not drink alcohol and does not use drugs.  Past Medical History:  Diagnosis Date   Bell's palsy 12/2018   Environmental and seasonal allergies    Hx of varicella    Hypercholesterolemia 12/13   on medication   Recurrent sinusitis    neg ct 2013    Skin cancer of face    cryotherapy done fall 2015   Vertigo     Past Surgical History:  Procedure Laterality Date   SHOULDER SURGERY Left 03/26/2008   torn labrum 3 pins   URETHRA SURGERY  07/26/1970    Current Outpatient Medications  Medication Sig Dispense Refill   cetirizine (ZYRTEC) 10 MG tablet Take 1 tablet by mouth daily as needed.     No current facility-administered medications for this visit.    Family History  Problem Relation Age of Onset   Heart disease Mother    Stroke Mother        in her 54s    Migraines Mother    Hypertension Father    Gout Father    Healthy Father        age 9   Heart attack Father    Healthy Brother        2    Review of Systems  All other systems reviewed and are negative.  Exam:   BP 114/64   Pulse 71   Ht 5\' 10"  (1.778 m)   Wt 206 lb (93.4 kg)   LMP  10/04/2015 Comment: last spotting was 1/17  BMI 29.56 kg/m   Height: 5\' 10"  (177.8 cm)  General appearance: alert, cooperative and appears stated age Head: Normocephalic, without obvious abnormality, atraumatic Neck: no adenopathy, supple, symmetrical, trachea midline and thyroid normal to inspection and palpation Lungs: clear to auscultation bilaterally Breasts: normal appearance, no masses or tenderness Heart: regular rate and rhythm Abdomen: soft, non-tender; bowel sounds normal; no masses,  no organomegaly Extremities: extremities normal, atraumatic, no cyanosis or edema Skin: Skin color, texture, turgor normal. No rashes or lesions Lymph nodes: Cervical, supraclavicular, and axillary nodes normal. No abnormal inguinal nodes palpated Neurologic: Grossly normal   Pelvic: External genitalia:  no lesions              Urethra:  normal appearing urethra with no masses, tenderness or lesions              Bartholins and Skenes: normal                 Vagina: normal appearing vagina with normal color and no discharge,  no lesions              Cervix: no lesions              Pap taken: Yes.   Bimanual Exam:  Uterus:  normal size, contour, position, consistency, mobility, non-tender              Adnexa: normal adnexa and no mass, fullness, tenderness               Rectovaginal: Confirms               Anus:  normal sphincter tone, no lesions  Chaperone, Prince Rome, CMA, was present for exam.  Assessment/Plan: 1. Well woman exam with routine gynecological exam - Pap with HR HPV obtained today - MMG 09/2020 - BMD will be planned closer to age 89 - Colonoscopy not done but negative cologuard 10/2020 - vaccines updated - lab work done with Dr. Regis Bill  2. Cervical cancer screening - Cytology - PAP( [place])  3. Postmenopausal - no HRT

## 2021-01-07 DIAGNOSIS — Z780 Asymptomatic menopausal state: Secondary | ICD-10-CM | POA: Insufficient documentation

## 2021-01-08 LAB — CYTOLOGY - PAP
Comment: NEGATIVE
Diagnosis: NEGATIVE
High risk HPV: NEGATIVE

## 2021-03-02 DIAGNOSIS — L821 Other seborrheic keratosis: Secondary | ICD-10-CM | POA: Diagnosis not present

## 2021-03-02 DIAGNOSIS — L814 Other melanin hyperpigmentation: Secondary | ICD-10-CM | POA: Diagnosis not present

## 2021-03-02 DIAGNOSIS — L738 Other specified follicular disorders: Secondary | ICD-10-CM | POA: Diagnosis not present

## 2021-03-26 ENCOUNTER — Ambulatory Visit (INDEPENDENT_AMBULATORY_CARE_PROVIDER_SITE_OTHER): Payer: Federal, State, Local not specified - PPO

## 2021-03-26 ENCOUNTER — Other Ambulatory Visit (INDEPENDENT_AMBULATORY_CARE_PROVIDER_SITE_OTHER): Payer: Federal, State, Local not specified - PPO

## 2021-03-26 ENCOUNTER — Other Ambulatory Visit: Payer: Self-pay

## 2021-03-26 DIAGNOSIS — Z23 Encounter for immunization: Secondary | ICD-10-CM

## 2021-03-26 DIAGNOSIS — E785 Hyperlipidemia, unspecified: Secondary | ICD-10-CM

## 2021-03-26 LAB — LIPID PANEL
Cholesterol: 229 mg/dL — ABNORMAL HIGH (ref 0–200)
HDL: 39.7 mg/dL (ref >39.00)
NonHDL: 189.16
Total CHOL/HDL Ratio: 6
Triglycerides: 257 mg/dL — ABNORMAL HIGH (ref 0.0–149.0)
VLDL: 51.4 mg/dL — ABNORMAL HIGH (ref 0.0–40.0)

## 2021-03-26 LAB — LDL CHOLESTEROL, DIRECT: Direct LDL: 146 mg/dL

## 2021-04-04 NOTE — Progress Notes (Signed)
Lipids improved but still need to lower   .  Try to get ldl down to 100 range .  Eventually   The 10-year ASCVD risk score (Arnett DK, et al., 2019) is: 2.3%   Values used to calculate the score:     Age: 55 years     Sex: Female     Is Non-Hispanic African American: No     Diabetic: No     Tobacco smoker: No     Systolic Blood Pressure: 99991111 mmHg     Is BP treated: No     HDL Cholesterol: 39.7 mg/dL     Total Cholesterol: 229 mg/dL

## 2021-04-06 ENCOUNTER — Telehealth: Payer: Self-pay | Admitting: Internal Medicine

## 2021-04-06 NOTE — Telephone Encounter (Signed)
Please see results note.

## 2021-04-06 NOTE — Telephone Encounter (Signed)
PT called to advise she is returning the call about results

## 2021-07-06 DIAGNOSIS — H04123 Dry eye syndrome of bilateral lacrimal glands: Secondary | ICD-10-CM | POA: Diagnosis not present

## 2021-07-06 DIAGNOSIS — H40013 Open angle with borderline findings, low risk, bilateral: Secondary | ICD-10-CM | POA: Diagnosis not present

## 2021-07-06 DIAGNOSIS — G510 Bell's palsy: Secondary | ICD-10-CM | POA: Diagnosis not present

## 2021-07-06 DIAGNOSIS — H2513 Age-related nuclear cataract, bilateral: Secondary | ICD-10-CM | POA: Diagnosis not present

## 2021-08-19 ENCOUNTER — Ambulatory Visit: Payer: Federal, State, Local not specified - PPO | Admitting: Internal Medicine

## 2021-08-19 ENCOUNTER — Encounter: Payer: Self-pay | Admitting: Internal Medicine

## 2021-08-19 VITALS — BP 118/78 | HR 73 | Temp 98.6°F | Ht 70.0 in | Wt 211.4 lb

## 2021-08-19 DIAGNOSIS — R208 Other disturbances of skin sensation: Secondary | ICD-10-CM | POA: Diagnosis not present

## 2021-08-19 DIAGNOSIS — E782 Mixed hyperlipidemia: Secondary | ICD-10-CM

## 2021-08-19 DIAGNOSIS — E785 Hyperlipidemia, unspecified: Secondary | ICD-10-CM

## 2021-08-19 DIAGNOSIS — R200 Anesthesia of skin: Secondary | ICD-10-CM

## 2021-08-19 LAB — HEPATIC FUNCTION PANEL
ALT: 28 U/L (ref 0–35)
AST: 22 U/L (ref 0–37)
Albumin: 4.8 g/dL (ref 3.5–5.2)
Alkaline Phosphatase: 89 U/L (ref 39–117)
Bilirubin, Direct: 0.1 mg/dL (ref 0.0–0.3)
Total Bilirubin: 0.4 mg/dL (ref 0.2–1.2)
Total Protein: 7.8 g/dL (ref 6.0–8.3)

## 2021-08-19 LAB — CBC WITH DIFFERENTIAL/PLATELET
Basophils Absolute: 0.1 10*3/uL (ref 0.0–0.1)
Basophils Relative: 0.8 % (ref 0.0–3.0)
Eosinophils Absolute: 0.2 10*3/uL (ref 0.0–0.7)
Eosinophils Relative: 2.3 % (ref 0.0–5.0)
HCT: 44.1 % (ref 36.0–46.0)
Hemoglobin: 14.7 g/dL (ref 12.0–15.0)
Lymphocytes Relative: 48.2 % — ABNORMAL HIGH (ref 12.0–46.0)
Lymphs Abs: 3.2 10*3/uL (ref 0.7–4.0)
MCHC: 33.4 g/dL (ref 30.0–36.0)
MCV: 93.4 fl (ref 78.0–100.0)
Monocytes Absolute: 0.3 10*3/uL (ref 0.1–1.0)
Monocytes Relative: 5.2 % (ref 3.0–12.0)
Neutro Abs: 2.9 10*3/uL (ref 1.4–7.7)
Neutrophils Relative %: 43.5 % (ref 43.0–77.0)
Platelets: 254 10*3/uL (ref 150.0–400.0)
RBC: 4.73 Mil/uL (ref 3.87–5.11)
RDW: 12.8 % (ref 11.5–15.5)
WBC: 6.7 10*3/uL (ref 4.0–10.5)

## 2021-08-19 LAB — BASIC METABOLIC PANEL
BUN: 22 mg/dL (ref 6–23)
CO2: 28 mEq/L (ref 19–32)
Calcium: 10.2 mg/dL (ref 8.4–10.5)
Chloride: 105 mEq/L (ref 96–112)
Creatinine, Ser: 0.96 mg/dL (ref 0.40–1.20)
GFR: 66.77 mL/min (ref >60.00)
Glucose, Bld: 89 mg/dL (ref 70–99)
Potassium: 4 mEq/L (ref 3.5–5.1)
Sodium: 140 mEq/L (ref 135–145)

## 2021-08-19 LAB — VITAMIN B12: Vitamin B-12: 316 pg/mL (ref 211–911)

## 2021-08-19 LAB — LIPID PANEL
Cholesterol: 256 mg/dL — ABNORMAL HIGH (ref 0–200)
HDL: 43.5 mg/dL (ref >39.00)
LDL Cholesterol: 181 mg/dL — ABNORMAL HIGH (ref 0–99)
NonHDL: 212.7
Total CHOL/HDL Ratio: 6
Triglycerides: 160 mg/dL — ABNORMAL HIGH (ref 0.0–149.0)
VLDL: 32 mg/dL (ref 0.0–40.0)

## 2021-08-19 LAB — T4, FREE: Free T4: 0.61 ng/dL (ref 0.60–1.60)

## 2021-08-19 LAB — HEMOGLOBIN A1C: Hgb A1c MFr Bld: 5.6 % (ref 4.6–6.5)

## 2021-08-19 LAB — TSH: TSH: 3.6 u[IU]/mL (ref 0.35–5.50)

## 2021-08-19 NOTE — Patient Instructions (Addendum)
Labs today . Change shoes as discussed .  Let us see how doing in another month or so .   Plan cpx in about 3 months

## 2021-08-19 NOTE — Progress Notes (Signed)
Chief Complaint  Patient presents with   feet tingling    Alternating some days worse than other days    HPI: Olivia Hill 56 y.o. come in for new concerns   see above  about 1.5 mos of numb type feeling like vibration on distal feet  off and on r more than left  seems to get worse after driving range and back tweaking  .  No specific injury  no weakness seems better  recently . No other assoc sx  trying to lose weight   ROS: See pertinent positives and negatives per HPI.  Past Medical History:  Diagnosis Date   Bell's palsy 12/2018   Environmental and seasonal allergies    Hx of varicella    Hypercholesterolemia 12/13   on medication   Recurrent sinusitis    neg ct 2013    Skin cancer of face    cryotherapy done fall 2015   Vertigo     Family History  Problem Relation Age of Onset   Heart disease Mother    Stroke Mother        in her 88s    Migraines Mother    Hypertension Father    Gout Father    Healthy Father        age 73   Heart attack Father    Healthy Brother        2    Social History   Socioeconomic History   Marital status: Single    Spouse name: Not on file   Number of children: Not on file   Years of education: Not on file   Highest education level: Not on file  Occupational History   Not on file  Tobacco Use   Smoking status: Never   Smokeless tobacco: Never  Vaping Use   Vaping Use: Never used  Substance and Sexual Activity   Alcohol use: No   Drug use: No   Sexual activity: Not Currently    Partners: Male    Birth control/protection: Post-menopausal  Other Topics Concern   Not on file  Social History Narrative   6-8 hours of sleep per night   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     No facility-administered medications prior to visit.     EXAM:  BP 118/78 (BP Location: Left Arm, Patient Position: Sitting, Cuff Size: Normal)    Pulse 73    Temp 98.6 F (37 C) (Oral)    Ht 5\' 10"  (1.778 m)    Wt 211 lb 6.4 oz (95.9 kg)    LMP 10/04/2015 Comment: last spotting was 1/17   SpO2 97%    BMI 30.33 kg/m   Body mass index is 30.33 kg/m.  GENERAL: vitals reviewed and listed above, alert, oriented, appears well hydrated and in no acute distress HEENT: atraumatic, conjunctiva  clear, no obvious abnormalities on inspection of  external nose and ears OP : masked  NECK: no obvious masses on inspection palpation  CV: HRRR, no clubbing cyanosis or  peripheral edema nl cap refill  MS: moves all extremities without noticeable focal  abnormality pulses normal gross sensation seems normal :  decreased vibratory sense on the right distal great toe area slightly down on the left  PSYCH: pleasant and cooperative, no obvious depression or anxiety Lab Results  Component Value Date   WBC 6.7 08/19/2021   HGB 14.7 08/19/2021   HCT 44.1 08/19/2021   PLT 254.0 08/19/2021   GLUCOSE 89 08/19/2021   CHOL 256 (H) 08/19/2021   TRIG 160.0 (H) 08/19/2021   HDL 43.50 08/19/2021   LDLDIRECT 146.0 03/26/2021   LDLCALC 181 (H) 08/19/2021   ALT 28 08/19/2021   AST 22 08/19/2021   NA 140 08/19/2021   K 4.0 08/19/2021   CL 105 08/19/2021   CREATININE 0.96 08/19/2021   BUN 22 08/19/2021   CO2 28 08/19/2021   TSH 3.60 08/19/2021   HGBA1C 5.6 08/19/2021   BP Readings from Last 3 Encounters:  08/19/21 118/78  01/05/21 114/64  10/08/20 118/82    ASSESSMENT AND PLAN:  Discussed the following assessment and plan:  Decreased  peripheral vibratory sense - Plan: Basic metabolic panel, TSH, T4, free, Hemoglobin A1c, CBC with Differential/Platelet, Hepatic function panel, Lipid panel, Vitamin B12, Vitamin B12, Lipid panel, Hepatic function panel, CBC with Differential/Platelet, Hemoglobin A1c, T4, free, TSH, Basic metabolic panel  Numbness - Plan: Basic metabolic panel, TSH, T4, free, Hemoglobin A1c, CBC with Differential/Platelet, Hepatic function panel, Lipid panel, Vitamin B12, Vitamin B12, Lipid panel, Hepatic function panel, CBC with Differential/Platelet, Hemoglobin A1c, T4, free, TSH, Basic metabolic panel  Hyperlipidemia, unspecified hyperlipidemia type - Plan: Basic metabolic panel, TSH, T4, free, Hemoglobin A1c, CBC with Differential/Platelet, Hepatic function panel, Lipid panel, Vitamin B12, Vitamin B12, Lipid panel, Hepatic function panel, CBC with Differential/Platelet, Hemoglobin A1c, T4, free, TSH, Basic metabolic panel  Elevated triglycerides with high cholesterol - Plan: Basic metabolic panel, TSH, T4, free, Hemoglobin A1c, CBC with Differential/Platelet, Hepatic function panel, Lipid panel, Vitamin B12, Vitamin B12, Lipid panel, Hepatic function panel, CBC with Differential/Platelet, Hemoglobin A1c, T4, free, TSH, Basic metabolic panel Update lab today due soon anyway. Some of the symptoms sounds mechanical possibly she related uncertain since it is getting better but have her look at mechanical factors and plan follow-up when she comes in for a CPX   rule out be 12 deficiency other problems -Patient advised to return or notify health care team  if  new concerns arise.  Patient Instructions  Labs today . Change shoes as discussed .  Let us see how doing in another month or so .   Plan cpx in about 3 months    Standley Brooking. Wyndham Santilli M.D.

## 2021-08-20 NOTE — Progress Notes (Signed)
Lipids still up even for non fasting . Rest of labs  basically normal  vit b12 is low normal , no diabetes.Intensify lifestyle interventions. And we can discuss lipids at fu

## 2021-10-15 DIAGNOSIS — Z1231 Encounter for screening mammogram for malignant neoplasm of breast: Secondary | ICD-10-CM | POA: Diagnosis not present

## 2021-10-15 LAB — HM MAMMOGRAPHY

## 2021-10-16 ENCOUNTER — Encounter (HOSPITAL_BASED_OUTPATIENT_CLINIC_OR_DEPARTMENT_OTHER): Payer: Self-pay | Admitting: *Deleted

## 2021-10-22 ENCOUNTER — Encounter: Payer: Self-pay | Admitting: Internal Medicine

## 2021-11-17 ENCOUNTER — Encounter: Payer: Federal, State, Local not specified - PPO | Admitting: Internal Medicine

## 2021-12-21 NOTE — Progress Notes (Unsigned)
No chief complaint on file.   HPI: Patient  Olivia Hill  56 y.o. comes in today for Preventive Health Care visit   Health Maintenance  Topic Date Due   HIV Screening  Never done   Hepatitis C Screening  Never done   TETANUS/TDAP  07/26/2021   COVID-19 Vaccine (3 - Booster for Pfizer series) 02/16/2022 (Originally 12/20/2019)   INFLUENZA VACCINE  02/23/2022   MAMMOGRAM  10/16/2023   Fecal DNA (Cologuard)  11/19/2023   PAP SMEAR-Modifier  01/06/2024   Zoster Vaccines- Shingrix  Completed   HPV VACCINES  Aged Out   Health Maintenance Review LIFESTYLE:  Exercise:   Tobacco/ETS: Alcohol:  Sugar beverages: Sleep: Drug use: no HH of  Work:    ROS:  GEN/ HEENT: No fever, significant weight changes sweats headaches vision problems hearing changes, CV/ PULM; No chest pain shortness of breath cough, syncope,edema  change in exercise tolerance. GI /GU: No adominal pain, vomiting, change in bowel habits. No blood in the stool. No significant GU symptoms. SKIN/HEME: ,no acute skin rashes suspicious lesions or bleeding. No lymphadenopathy, nodules, masses.  NEURO/ PSYCH:  No neurologic signs such as weakness numbness. No depression anxiety. IMM/ Allergy: No unusual infections.  Allergy .   REST of 12 system review negative except as per HPI   Past Medical History:  Diagnosis Date   Bell's palsy 12/2018   Environmental and seasonal allergies    Hx of varicella    Hypercholesterolemia 12/13   on medication   Recurrent sinusitis    neg ct 2013    Skin cancer of face    cryotherapy done fall 2015   Vertigo     Past Surgical History:  Procedure Laterality Date   SHOULDER SURGERY Left 03/26/2008   torn labrum 3 pins   URETHRA SURGERY  07/26/1970    Family History  Problem Relation Age of Onset   Heart disease Mother    Stroke Mother        in her 35s    Migraines Mother    Hypertension Father    Gout Father    Healthy Father        age 76   Heart attack  Father    Healthy Brother        2    Social History   Socioeconomic History   Marital status: Single    Spouse name: Not on file   Number of children: Not on file   Years of education: Not on file   Highest education level: Not on file  Occupational History   Not on file  Tobacco Use   Smoking status: Never   Smokeless tobacco: Never  Vaping Use   Vaping Use: Never used  Substance and Sexual Activity   Alcohol use: No   Drug use: No   Sexual activity: Not Currently    Partners: Male    Birth control/protection: Post-menopausal  Other Topics Concern   Not on file  Social History Narrative   6-8 hours of sleep per night   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     No facility-administered medications prior to visit.     EXAM:  LMP 10/04/2015 Comment: last spotting was 1/17  There is no height or weight on file to calculate BMI. Wt Readings from Last 3 Encounters:  08/19/21 211 lb 6.4 oz (95.9 kg)  01/05/21 206 lb (93.4 kg)  10/08/20 207 lb (93.9 kg)    Physical Exam: Vital signs reviewed WOE:HOZY is a well-developed well-nourished alert cooperative    who appearsr stated age in no acute distress.  HEENT: normocephalic atraumatic , Eyes: PERRL EOM's full, conjunctiva clear, Nares: paten,t no deformity discharge or tenderness., Ears: no deformity EAC's clear TMs with normal landmarks. NECK: supple without masses, thyromegaly or bruits. CHEST/PULM:  Clear to auscultation and  percussion breath sounds equal no wheeze , rales or rhonchi. No chest wall deformities or tenderness. Breast: normal by inspection . No dimpling, discharge, masses, tenderness or discharge . CV: PMI is nondisplaced, S1 S2 no gallops, murmurs, rubs. Peripheral pulses are full without delay.No JVD .  ABDOMEN: Bowel sounds normal nontender  No guard or rebound, no hepato splenomegal no CVA tenderness.  No hernia. Extremtities:  No clubbing cyanosis or edema, no acute joint swelling or redness no focal atrophy NEURO:  Oriented x3, cranial nerves 3-12 appear to be intact, no obvious focal weakness,gait within normal limits no abnormal reflexes or asymmetrical SKIN: No acute rashes normal turgor, color, no bruising or petechiae. PSYCH: Oriented, good eye contact, no obvious depression anxiety, cognition and judgment appear normal. LN: no cervical axillary inguinal adenopathy  Lab Results  Component Value Date   WBC 6.7 08/19/2021   HGB 14.7 08/19/2021   HCT 44.1 08/19/2021   PLT 254.0 08/19/2021   GLUCOSE 89 08/19/2021   CHOL 256 (H) 08/19/2021   TRIG 160.0 (H) 08/19/2021   HDL 43.50 08/19/2021   LDLDIRECT 146.0 03/26/2021   LDLCALC 181 (H) 08/19/2021   ALT 28 08/19/2021   AST 22 08/19/2021   NA 140 08/19/2021   K 4.0 08/19/2021   CL 105 08/19/2021   CREATININE 0.96 08/19/2021   BUN 22 08/19/2021   CO2 28 08/19/2021   TSH 3.60 08/19/2021   HGBA1C 5.6 08/19/2021    BP Readings from Last 3 Encounters:  08/19/21 118/78  01/05/21 114/64  10/08/20 118/82    Lab results reviewed with patient  B12 300 reange ASSESSMENT AND PLAN:  Discussed the following assessment and plan:    ICD-10-CM   1. Visit for preventive health examination  Z00.00     2. Hyperlipidemia, unspecified hyperlipidemia type  E78.5    cac scoer 0 may 2022    Low normal b12 level  Cac score of 0   consider check lpa  No follow-ups on file.  Patient Care Team: Miguel Christiana, Standley Brooking, MD as PCP - General (Internal  Medicine) Gean Quint, MD (Inactive) as Consulting Physician (Allergy and Immunology) Regina Eck, CNM as Referring Physician (Certified Nurse Midwife) Danella Sensing, MD as Consulting Physician (Dermatology) sandra fuller dds  Koleen Nimrod Maxine Glenn, MD as Referring Physician (Internal Medicine) There are no Patient Instructions on file for this visit.  Standley Brooking. Tamana Hatfield M.D.

## 2021-12-22 ENCOUNTER — Ambulatory Visit (INDEPENDENT_AMBULATORY_CARE_PROVIDER_SITE_OTHER): Payer: Federal, State, Local not specified - PPO | Admitting: Internal Medicine

## 2021-12-22 ENCOUNTER — Encounter: Payer: Self-pay | Admitting: Internal Medicine

## 2021-12-22 VITALS — BP 116/74 | HR 70 | Temp 98.0°F | Ht 70.0 in | Wt 213.4 lb

## 2021-12-22 DIAGNOSIS — E785 Hyperlipidemia, unspecified: Secondary | ICD-10-CM | POA: Diagnosis not present

## 2021-12-22 DIAGNOSIS — Z0000 Encounter for general adult medical examination without abnormal findings: Secondary | ICD-10-CM

## 2021-12-22 LAB — LIPID PANEL
Cholesterol: 273 mg/dL — ABNORMAL HIGH (ref 0–200)
HDL: 46.6 mg/dL (ref >39.00)
NonHDL: 226.59
Total CHOL/HDL Ratio: 6
Triglycerides: 313 mg/dL — ABNORMAL HIGH (ref 0.0–149.0)
VLDL: 62.6 mg/dL — ABNORMAL HIGH (ref 0.0–40.0)

## 2021-12-22 LAB — LDL CHOLESTEROL, DIRECT: Direct LDL: 180 mg/dL

## 2021-12-22 NOTE — Patient Instructions (Signed)
Good to see you  Continue lifestyle intervention healthy eating and exercise .   Lipids  fu today .

## 2021-12-25 LAB — LIPOPROTEIN A (LPA): Lipoprotein (a): 25 nmol/L

## 2022-01-06 NOTE — Progress Notes (Signed)
Cholesterol is higher  ldl now 180  in the range to consider medication even with cac score of 0   but your lipoprotein A is not elevated which is reassuring .    See if you can Intensify lifestyle interventions.  To reduce cholesterol level   repeat FLP in 4 months.  If you want to discuss medication  make a virtual visit  ( plant based, mediterranean  eating  may be helpful)        Age: 56 years     Sex: Female     Is Non-Hispanic African American: No     Diabetic: No     Tobacco smoker: No     Systolic Blood Pressure: 759 mmHg     Is BP treated: No     HDL Cholesterol: 46.6 mg/dL     Total Cholesterol: 273 mg/dL

## 2022-01-07 ENCOUNTER — Ambulatory Visit (HOSPITAL_BASED_OUTPATIENT_CLINIC_OR_DEPARTMENT_OTHER): Payer: Federal, State, Local not specified - PPO | Admitting: Obstetrics & Gynecology

## 2022-01-07 ENCOUNTER — Ambulatory Visit (INDEPENDENT_AMBULATORY_CARE_PROVIDER_SITE_OTHER): Payer: Federal, State, Local not specified - PPO | Admitting: Obstetrics & Gynecology

## 2022-01-07 ENCOUNTER — Encounter (HOSPITAL_BASED_OUTPATIENT_CLINIC_OR_DEPARTMENT_OTHER): Payer: Self-pay | Admitting: Obstetrics & Gynecology

## 2022-01-07 VITALS — BP 128/85 | HR 73 | Ht 70.25 in | Wt 210.2 lb

## 2022-01-07 DIAGNOSIS — Z8669 Personal history of other diseases of the nervous system and sense organs: Secondary | ICD-10-CM

## 2022-01-07 DIAGNOSIS — E041 Nontoxic single thyroid nodule: Secondary | ICD-10-CM

## 2022-01-07 DIAGNOSIS — Z01419 Encounter for gynecological examination (general) (routine) without abnormal findings: Secondary | ICD-10-CM | POA: Diagnosis not present

## 2022-01-07 DIAGNOSIS — Z780 Asymptomatic menopausal state: Secondary | ICD-10-CM | POA: Diagnosis not present

## 2022-01-07 NOTE — Progress Notes (Signed)
56 y.o. G0P0000 Single White or Caucasian female here for annual exam.  She is building a house and hopefully in her home by early December.    Had elevated cholesterol with lab work in 12/22/2021.  She has changed to [place] diet and exercising more regularly.  Is having a recheck in 4 months.    Denies vaginal bleeding.    Patient's last menstrual period was 10/04/2015.          Sexually active: No.  The current method of family planning is post menopausal status.    Exercising: No.   Smoker:  no  Health Maintenance: Pap:  01/05/2021 Negative History of abnormal Pap:  no MMG:  10/15/2021 Negative Colonoscopy:  cologuard 10/2020 BMD:   Not indicated  Screening Labs: done in May 2023   reports that she has never smoked. She has never used smokeless tobacco. She reports that she does not drink alcohol and does not use drugs.  Past Medical History:  Diagnosis Date   Bell's palsy 12/2018   Environmental and seasonal allergies    Hx of varicella    Hypercholesterolemia 12/13   on medication   Recurrent sinusitis    neg ct 2013    Skin cancer of face    cryotherapy done fall 2015   Vertigo     Past Surgical History:  Procedure Laterality Date   SHOULDER SURGERY Left 03/26/2008   torn labrum 3 pins   URETHRA SURGERY  07/26/1970    Current Outpatient Medications  Medication Sig Dispense Refill   cetirizine (ZYRTEC) 10 MG tablet Take 1 tablet by mouth daily as needed.     No current facility-administered medications for this visit.    Family History  Problem Relation Age of Onset   Heart disease Mother    Stroke Mother        in her 94s    Migraines Mother    Hypertension Father    Gout Father    Healthy Father        age 53   Heart attack Father    Healthy Brother        2   GYN: Genitourinary:negative  Exam:   BP 128/85 (BP Location: Right Arm, Patient Position: Sitting, Cuff Size: Large)   Pulse 73   Ht 5' 10.25" (1.784 m)   Wt 210 lb 3.2 oz (95.3 kg)    LMP 10/04/2015 Comment: last spotting was 1/17  BMI 29.95 kg/m   Height: 5' 10.25" (178.4 cm)  General appearance: alert, cooperative and appears stated age Head: Normocephalic, without obvious abnormality, atraumatic Neck: no adenopathy, supple, symmetrical, trachea midline and thyroid normal to inspection and palpation Lungs: clear to auscultation bilaterally Breasts: normal appearance, no masses or tenderness Heart: regular rate and rhythm Abdomen: soft, non-tender; bowel sounds normal; no masses,  no organomegaly Extremities: extremities normal, atraumatic, no cyanosis or edema Skin: Skin color, texture, turgor normal. No rashes or lesions Lymph nodes: Cervical, supraclavicular, and axillary nodes normal. No abnormal inguinal nodes palpated Neurologic: Grossly normal   Pelvic: External genitalia:  no lesions              Urethra:  normal appearing urethra with no masses, tenderness or lesions              Bartholins and Skenes: normal                 Vagina: normal appearing vagina with normal color and no discharge, no lesions  Cervix: no lesions              Pap taken: No. Bimanual Exam:  Uterus:  normal size, contour, position, consistency, mobility, non-tender              Adnexa: normal adnexa and no mass, fullness, tenderness               Rectovaginal: Confirms               Anus:  normal sphincter tone, no lesions  Chaperone, Octaviano Batty, CMA, was present for exam.  Assessment/Plan: 1. Well woman exam with routine gynecological exam - pap neg with neg HR HPV 01/05/2021.  Not indicated today. - MMG 10/15/2021 - cologuard negative 10/2020.  Repeat in 2025. - Plan BMD closer to age 88.  Calcium supplementation discussed today. - lab work done with Dr. Regis Bill  2. Postmenopausal - no HRT use.  3. Thyroid nodule - followed by endocrinology at [facility].  Has undergone serial ultrasounds.  4. History of Bell's palsy

## 2022-01-20 DIAGNOSIS — H43812 Vitreous degeneration, left eye: Secondary | ICD-10-CM | POA: Diagnosis not present

## 2022-03-09 DIAGNOSIS — D225 Melanocytic nevi of trunk: Secondary | ICD-10-CM | POA: Diagnosis not present

## 2022-03-09 DIAGNOSIS — L821 Other seborrheic keratosis: Secondary | ICD-10-CM | POA: Diagnosis not present

## 2022-03-09 DIAGNOSIS — L309 Dermatitis, unspecified: Secondary | ICD-10-CM | POA: Diagnosis not present

## 2022-03-09 DIAGNOSIS — L814 Other melanin hyperpigmentation: Secondary | ICD-10-CM | POA: Diagnosis not present

## 2022-03-17 DIAGNOSIS — H43812 Vitreous degeneration, left eye: Secondary | ICD-10-CM | POA: Diagnosis not present

## 2022-05-17 ENCOUNTER — Ambulatory Visit: Payer: Federal, State, Local not specified - PPO | Admitting: Family Medicine

## 2022-06-27 IMAGING — CT CT CARDIAC CORONARY ARTERY CALCIUM SCORE
3 series · 14 of 20 positions shown, 15 images · non-contrast
Comparison: None.
COMPARISON: None.

Addendum:
EXAM:
OVER-READ INTERPRETATION  CT CHEST

The following report is an over-read performed by radiologist Dr.
Knut Erik Svanemyr [REDACTED] on 11/26/2020. This
over-read does not include interpretation of cardiac or coronary
anatomy or pathology. The coronary calcium score interpretation by
the cardiologist is attached.
CLINICAL DATA: Cardiovascular Disease Risk stratification
Coronary Calcium Score
TECHNIQUE: A gated, non-contrast computed tomography scan of the heart was
performed using 3mm slice thickness. Axial images were analyzed on a
dedicated workstation. Calcium scoring of the coronary arteries was
performed using the Agatston method.

[Series 2: casc 3.0 bv41 2 bestdiast 66 % · axial · 0.44mm/px · z∈[-228,-148]mm · 4 of 45 slices shown, 5 images]
[im 9/45  vessel]
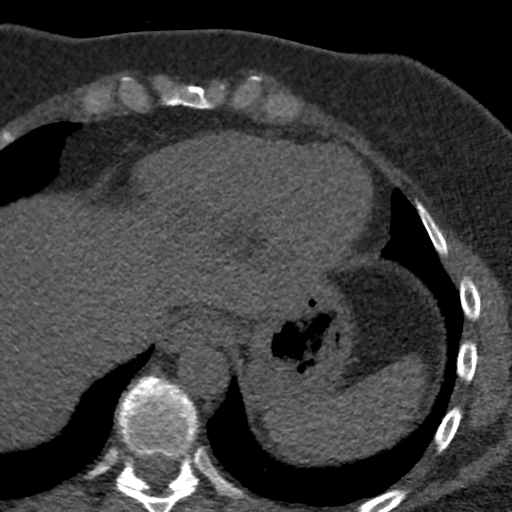
[im 9/45  lung]
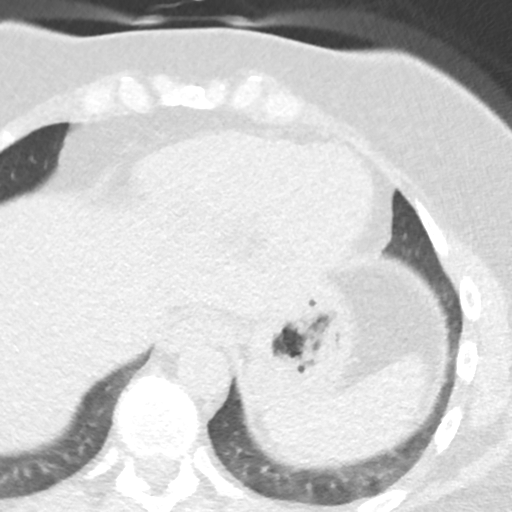
[im 18/45  vessel]
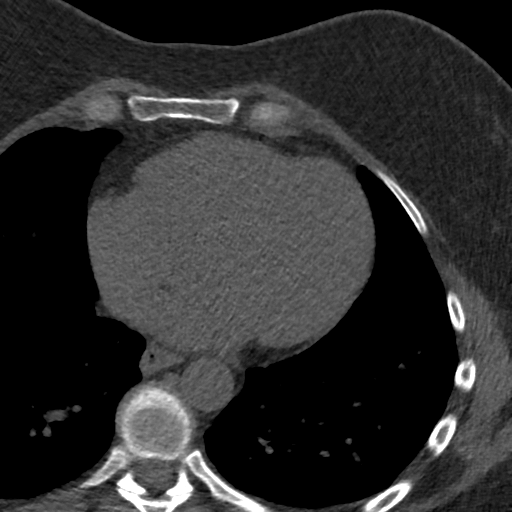
[im 27/45  vessel]
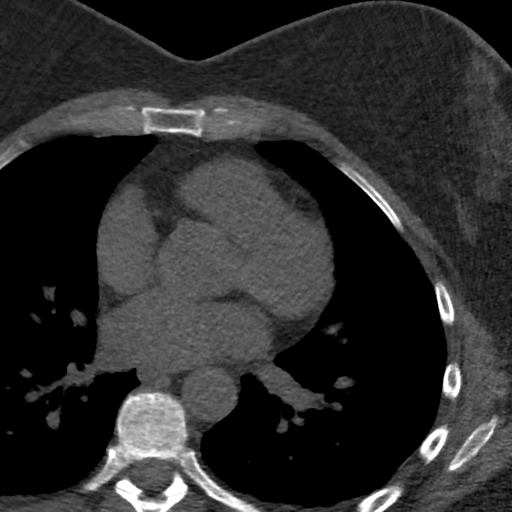
[im 36/45  vessel]
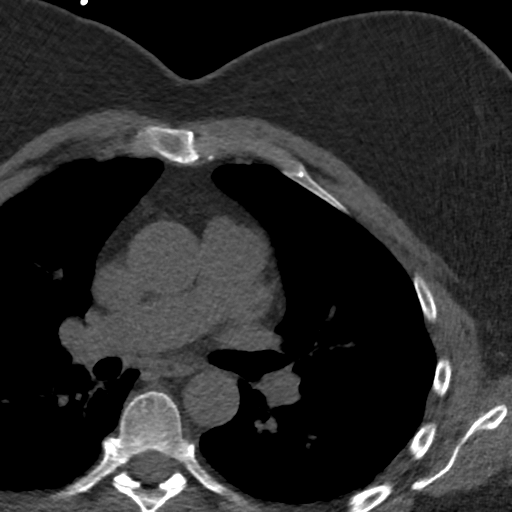

[Series 3: lung 66 % · axial · 0.68mm/px · z∈[-232,-144]mm · 5 of 45 slices shown]
[im 8/45  lung]
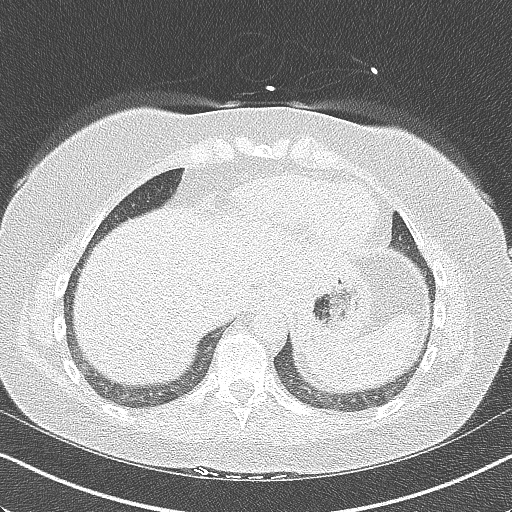
[im 15/45  lung]
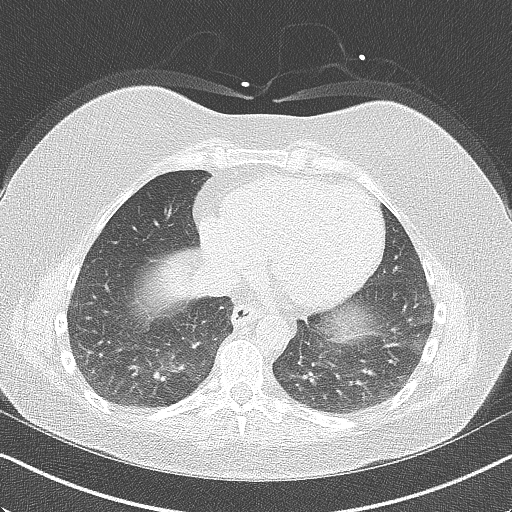
[im 23/45  lung]
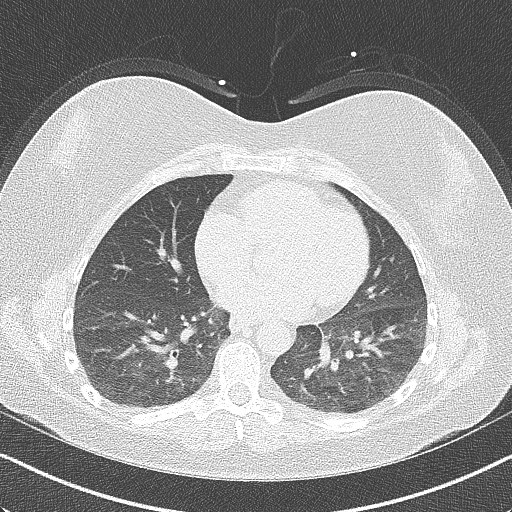
[im 30/45  lung]
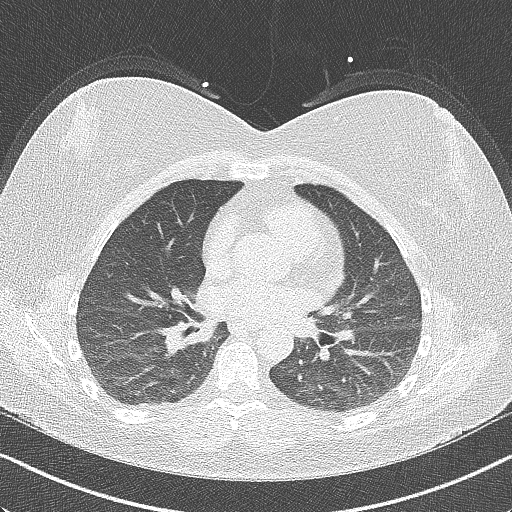
[im 37/45  lung]
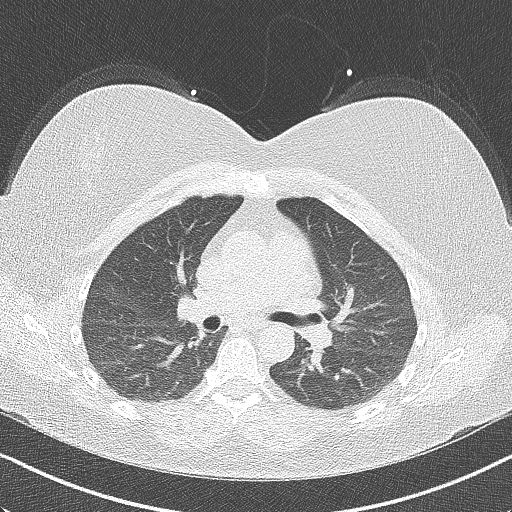

[Series 4: lung st 66 % · axial · 0.68mm/px · z∈[-232,-144]mm · 5 of 45 slices shown]
[im 8/45  lung]
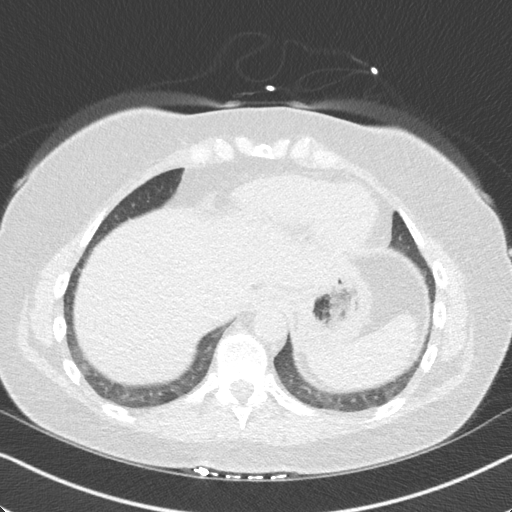
[im 15/45  lung]
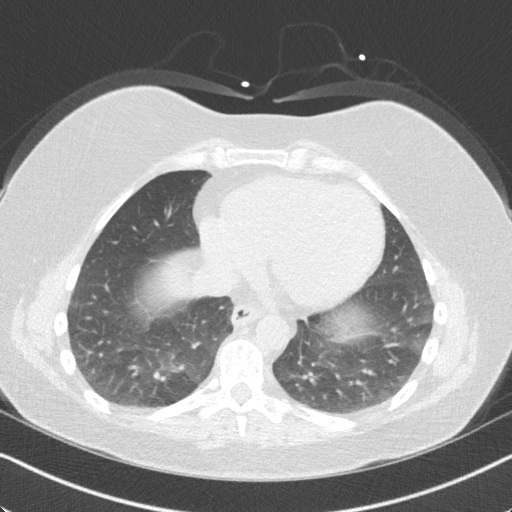
[im 23/45  lung]
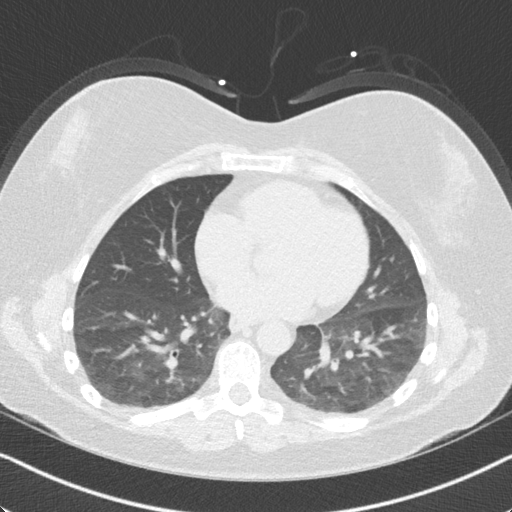
[im 30/45  lung]
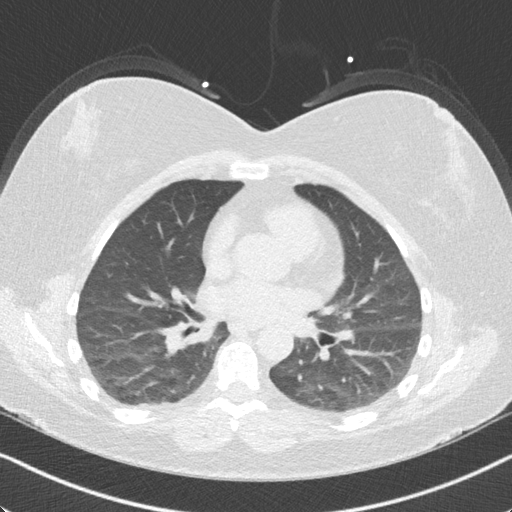
[im 37/45  lung]
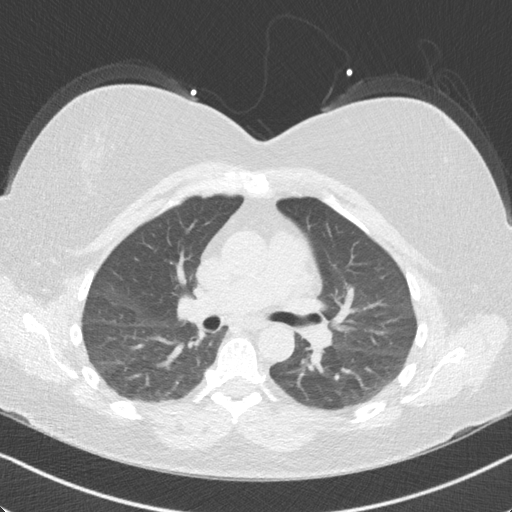

[14 of 20 positions shown; findings below may reference images not displayed]

FINDINGS: Within the visualized portions of the thorax there are no suspicious
appearing pulmonary nodules or masses, there is no acute
consolidative airspace disease, no pleural effusions, no
pneumothorax and no lymphadenopathy. Visualized portions of the
upper abdomen demonstrates mild diffuse low attenuation throughout
the visualized hepatic parenchyma, indicative of hepatic steatosis.
There are no aggressive appearing lytic or blastic lesions noted in
the visualized portions of the skeleton.
IMPRESSION: 1. Hepatic steatosis.
FINDINGS: Coronary Calcium Score:

Left main: 0

Left anterior descending artery: 0

Left circumflex artery: 0

Right coronary artery: 0

Total: 0

Percentile: NA

Pericardium: Normal.

Ascending Aorta: Normal caliber.

Non-cardiac: See separate report from [REDACTED].
IMPRESSION: Coronary calcium score of 0.



If CAC=0, it is reasonable to withhold statin therapy and reassess
in 5 to 10 years, as long as higher risk conditions are absent
(diabetes mellitus, family history of premature CHD in first degree
relatives (males <55 years; females <65 years), cigarette smoking,
or LDL >=190 mg/dL).

If CAC is 1 to 99, it is reasonable to initiate statin therapy for
patients >=55 years of age.

If CAC is >=100 or >=75th percentile, it is reasonable to initiate
statin therapy at any age.

Cardiology referral should be considered for patients with CAC
scores >=400 or >=75th percentile.

*7926 AHA/ACC/AACVPR/AAPA/ABC/BARROS/EGGER/DOD/Mason-Burnett/ONIKEKU/LHESAIN/AWA
Guideline on the Management of Blood Cholesterol: A Report of the
American College of Cardiology/American Heart Association Task Force
on Clinical Practice Guidelines. J Am Coll Cardiol.
4031;73(24):3524-3039.

*** End of Addendum ***
EXAM:
OVER-READ INTERPRETATION  CT CHEST

The following report is an over-read performed by radiologist Dr.
Knut Erik Svanemyr [REDACTED] on 11/26/2020. This
over-read does not include interpretation of cardiac or coronary
anatomy or pathology. The coronary calcium score interpretation by
the cardiologist is attached.
FINDINGS: Within the visualized portions of the thorax there are no suspicious
appearing pulmonary nodules or masses, there is no acute
consolidative airspace disease, no pleural effusions, no
pneumothorax and no lymphadenopathy. Visualized portions of the
upper abdomen demonstrates mild diffuse low attenuation throughout
the visualized hepatic parenchyma, indicative of hepatic steatosis.
There are no aggressive appearing lytic or blastic lesions noted in
the visualized portions of the skeleton.
IMPRESSION: 1. Hepatic steatosis.

## 2022-06-29 DIAGNOSIS — L821 Other seborrheic keratosis: Secondary | ICD-10-CM | POA: Diagnosis not present

## 2022-06-29 DIAGNOSIS — L853 Xerosis cutis: Secondary | ICD-10-CM | POA: Diagnosis not present

## 2022-08-04 ENCOUNTER — Ambulatory Visit: Payer: Federal, State, Local not specified - PPO | Admitting: Family Medicine

## 2022-08-04 VITALS — BP 110/80 | HR 80 | Temp 98.3°F | Wt 216.0 lb

## 2022-08-04 DIAGNOSIS — J40 Bronchitis, not specified as acute or chronic: Secondary | ICD-10-CM

## 2022-08-04 DIAGNOSIS — R059 Cough, unspecified: Secondary | ICD-10-CM | POA: Diagnosis not present

## 2022-08-04 LAB — POC COVID19 BINAXNOW: SARS Coronavirus 2 Ag: NEGATIVE

## 2022-08-04 MED ORDER — HYDROCODONE BIT-HOMATROP MBR 5-1.5 MG/5ML PO SOLN: 5.0000 mL | ORAL | 0 refills | Status: DC | PRN

## 2022-08-04 MED ORDER — AZITHROMYCIN 250 MG PO TABS: ORAL_TABLET | ORAL | 0 refills | Status: DC

## 2022-08-04 NOTE — Progress Notes (Signed)
   Subjective:    Patient ID: Olivia Hill, female    DOB: June 29, 1966, 57 y.o.   MRN: [id number]  HPI Here for 6 days of PND, chest congestion, and a cough producing yellow sputum. No fever or SOB.    Review of Systems  Constitutional: Negative.   HENT:  Positive for congestion and postnasal drip. Negative for ear pain, sinus pressure and sore throat.   Eyes: Negative.   Respiratory:  Positive for cough. Negative for shortness of breath and wheezing.        Objective:   Physical Exam Constitutional:      Appearance: Normal appearance.  HENT:     Right Ear: Tympanic membrane, ear canal and external ear normal.     Left Ear: Tympanic membrane, ear canal and external ear normal.     Nose: Nose normal.     Mouth/Throat:     Pharynx: Oropharynx is clear.  Eyes:     Conjunctiva/sclera: Conjunctivae normal.  Pulmonary:     Effort: Pulmonary effort is normal.     Breath sounds: Normal breath sounds.  Lymphadenopathy:     Cervical: No cervical adenopathy.  Neurological:     Mental Status: She is alert.           Assessment & Plan:  Bronchitis, treat with a Zpack. Add Hycodan and Mucinex as needed.  Alysia Penna, MD

## 2022-08-04 NOTE — Addendum Note (Signed)
Addended by: Wyvonne Lenz on: 08/04/2022 03:53 PM   Modules accepted: Orders

## 2022-10-21 DIAGNOSIS — Z1231 Encounter for screening mammogram for malignant neoplasm of breast: Secondary | ICD-10-CM | POA: Diagnosis not present

## 2022-10-21 LAB — HM MAMMOGRAPHY

## 2022-10-28 DIAGNOSIS — H04123 Dry eye syndrome of bilateral lacrimal glands: Secondary | ICD-10-CM | POA: Diagnosis not present

## 2022-10-28 DIAGNOSIS — H2513 Age-related nuclear cataract, bilateral: Secondary | ICD-10-CM | POA: Diagnosis not present

## 2022-10-28 DIAGNOSIS — H40013 Open angle with borderline findings, low risk, bilateral: Secondary | ICD-10-CM | POA: Diagnosis not present

## 2022-10-28 DIAGNOSIS — H43812 Vitreous degeneration, left eye: Secondary | ICD-10-CM | POA: Diagnosis not present

## 2022-11-01 DIAGNOSIS — M545 Low back pain, unspecified: Secondary | ICD-10-CM | POA: Diagnosis not present

## 2022-11-01 DIAGNOSIS — M25561 Pain in right knee: Secondary | ICD-10-CM | POA: Diagnosis not present

## 2023-01-13 ENCOUNTER — Ambulatory Visit (INDEPENDENT_AMBULATORY_CARE_PROVIDER_SITE_OTHER): Payer: Federal, State, Local not specified - PPO | Admitting: Obstetrics & Gynecology

## 2023-01-13 ENCOUNTER — Encounter (HOSPITAL_BASED_OUTPATIENT_CLINIC_OR_DEPARTMENT_OTHER): Payer: Self-pay | Admitting: Obstetrics & Gynecology

## 2023-01-13 VITALS — BP 122/75 | HR 68 | Ht 71.0 in | Wt 214.2 lb

## 2023-01-13 DIAGNOSIS — Z1231 Encounter for screening mammogram for malignant neoplasm of breast: Secondary | ICD-10-CM | POA: Diagnosis not present

## 2023-01-13 DIAGNOSIS — Z23 Encounter for immunization: Secondary | ICD-10-CM | POA: Diagnosis not present

## 2023-01-13 DIAGNOSIS — Z01419 Encounter for gynecological examination (general) (routine) without abnormal findings: Secondary | ICD-10-CM

## 2023-01-13 DIAGNOSIS — Z780 Asymptomatic menopausal state: Secondary | ICD-10-CM | POA: Diagnosis not present

## 2023-01-13 NOTE — Progress Notes (Signed)
57 y.o. G0P0000 Single White or Caucasian female here for annual exam.  Still working on building her home.  Denies vaginal bleeding.    She is aware lipids are elevated.  She was to have follow up last year.  She really feel likes she can control diet better once she gets into her new.  Declines repeating blood work today.  She did do a coronary calcium score and it was 0.    Patient's last menstrual period was 10/04/2015.          Sexually active: No.  The current method of family planning is post menopausal status.    Exercising: no Smoker:  no  Health Maintenance: Pap:  01/05/2021 Negative History of abnormal Pap:  no MMG:  10/21/2022 Negative Colon cancer screening:  10/2020 negative cologuard BMD:   guidelines reviewed Screening Labs: declined today   reports that she has never smoked. She has never used smokeless tobacco. She reports that she does not drink alcohol and does not use drugs.  Past Medical History:  Diagnosis Date   Bell's palsy 12/2018   Environmental and seasonal allergies    Hx of varicella    Hypercholesterolemia 12/13   on medication   Recurrent sinusitis    neg ct 2013    Skin cancer of face    cryotherapy done fall 2015   Vertigo     Past Surgical History:  Procedure Laterality Date   SHOULDER SURGERY Left 03/26/2008   torn labrum 3 pins   URETHRA SURGERY  07/26/1970    Current Outpatient Medications  Medication Sig Dispense Refill   cetirizine (ZYRTEC) 10 MG tablet Take 1 tablet by mouth daily as needed.     No current facility-administered medications for this visit.    Family History  Problem Relation Age of Onset   Heart disease Mother    Stroke Mother        in her 26s    Migraines Mother    Hypertension Father    Gout Father    Healthy Father        age 49   Heart attack Father    Healthy Brother        2    ROS: Constitutional: negative Genitourinary:negative  Exam:   BP 122/75 (BP Location: Right Arm, Patient Position:  Sitting, Cuff Size: Large)   Pulse 68   Ht 5\' 11"  (1.803 m) Comment: Reported  Wt 214 lb 3.2 oz (97.2 kg)   LMP 10/04/2015 Comment: last spotting was 1/17  BMI 29.87 kg/m   Height: 5\' 11"  (180.3 cm) (Reported)  General appearance: alert, cooperative and appears stated age Head: Normocephalic, without obvious abnormality, atraumatic Neck: no adenopathy, supple, symmetrical, trachea midline and thyroid normal to inspection and palpation Lungs: clear to auscultation bilaterally Breasts: normal appearance, no masses or tenderness Heart: regular rate and rhythm Abdomen: soft, non-tender; bowel sounds normal; no masses,  no organomegaly Extremities: extremities normal, atraumatic, no cyanosis or edema Skin: Skin color, texture, turgor normal. No rashes or lesions Lymph nodes: Cervical, supraclavicular, and axillary nodes normal. No abnormal inguinal nodes palpated Neurologic: Grossly normal   Pelvic: External genitalia:  no lesions              Urethra:  normal appearing urethra with no masses, tenderness or lesions              Bartholins and Skenes: normal  Vagina: normal appearing vagina with normal color and no discharge, no lesions              Cervix: no lesions              Pap taken: No. Bimanual Exam:  Uterus:  normal size, contour, position, consistency, mobility, non-tender              Adnexa: no mass, fullness, tenderness               Rectovaginal: Confirms               Anus:  normal sphincter tone, no lesions  Chaperone, Ina Homes, CMA, was present for exam.  Assessment/Plan: 1. Well woman exam with routine gynecological exam - Pap smear 2022.  Follow up 1 year. - Mammogram 09/2022. - Colonoscopy declined.  Cologuard neg 10/2020.  Will repeat next year. - Bone mineral density guidelines reviewed.  Vit D OTC recommended. - lab work done declineds - vaccines reviewed/updated  2. Encounter for screening mammogram for malignant neoplasm of breast -  MM 3D SCREENING MAMMOGRAM BILATERAL BREAST; Future  3. Postmenopausal - not on HRT

## 2023-02-26 ENCOUNTER — Other Ambulatory Visit: Payer: Self-pay

## 2023-02-26 ENCOUNTER — Encounter (HOSPITAL_BASED_OUTPATIENT_CLINIC_OR_DEPARTMENT_OTHER): Payer: Self-pay

## 2023-02-26 ENCOUNTER — Emergency Department (HOSPITAL_BASED_OUTPATIENT_CLINIC_OR_DEPARTMENT_OTHER)
Admission: EM | Admit: 2023-02-26 | Discharge: 2023-02-26 | Disposition: A | Discharge status: Home / Self Care | Payer: Federal, State, Local not specified - PPO | Attending: Emergency Medicine | Admitting: Emergency Medicine

## 2023-02-26 ENCOUNTER — Other Ambulatory Visit (HOSPITAL_BASED_OUTPATIENT_CLINIC_OR_DEPARTMENT_OTHER): Payer: Self-pay

## 2023-02-26 DIAGNOSIS — R21 Rash and other nonspecific skin eruption: Secondary | ICD-10-CM | POA: Diagnosis not present

## 2023-02-26 DIAGNOSIS — L237 Allergic contact dermatitis due to plants, except food: Secondary | ICD-10-CM | POA: Diagnosis not present

## 2023-02-26 MED ORDER — PREDNISONE 10 MG PO TABS
ORAL_TABLET | ORAL | 0 refills | Status: DC
  Filled 2023-02-26 (×2): qty 42, 12d supply, fill #0

## 2023-02-26 NOTE — ED Triage Notes (Signed)
Patient arrives with complaints of worsening eye swelling due to poison oak. Patient is also developing an itchy rash to her finger.

## 2023-02-26 NOTE — Discharge Instructions (Signed)
Contact a health care provider if: You have open sores in the rash area. You have any signs of infection. You have redness that spreads beyond the rash area. You have a fever. You have a rash over a large area of your body. You have a rash on your eyes, mouth, or genitals. You have a rash that does not improve after a few weeks. Get help right away if: Your face swells or your eyes swell shut. You have trouble breathing. You have trouble swallowing. These symptoms may be an emergency. Get help right away. Call 911. Do not wait to see if the symptoms will go away. Do not drive yourself to the hospital.

## 2023-02-26 NOTE — ED Provider Notes (Signed)
[place] EMERGENCY DEPARTMENT AT [facility] Provider Note   CSN: [id number] Arrival date & time: 02/26/23  1005     History  Chief Complaint  Patient presents with   Rash   Poison Ivy    Olivia Hill is a 57 y.o. female who presents emergency department for chief complaint of poison oak dermatitis.  Patient was doing yard work when she accidentally pulled the poison ivy with her hand.  She then accidentally wipe the sweat from her eyes and face.  She is now having development of bullae on her fingers and swelling around both of her eyes.  She also has some patches erupting on her neck and arms.  She denies fevers chills changes in lotions soaps or detergents.   Rash Poison Ivy       Home Medications Prior to Admission medications   Medication Sig Start Date End Date Taking? Authorizing Provider  cetirizine (ZYRTEC) 10 MG tablet Take 1 tablet by mouth daily as needed.    [provider]      Allergies    Nyquil severe cold-flu  [phenyleph-doxylamine-dm-apap]    Review of Systems   Review of Systems  Skin:  Positive for rash.    Physical Exam Updated Vital Signs BP 134/82 (BP Location: Right Arm)   Pulse 69   Temp 98.6 F (37 C)   Resp 16   Ht 5\' 11"  (1.803 m)   Wt 97.5 kg   LMP 10/04/2015 Comment: last spotting was 1/17  SpO2 100%   BMI 29.99 kg/m  Physical Exam Vitals and nursing note reviewed.  Constitutional:      General: She is not in acute distress.    Appearance: She is well-developed. She is not diaphoretic.  HENT:     Head: Normocephalic and atraumatic.     Right Ear: External ear normal.     Left Ear: External ear normal.     Nose: Nose normal.     Mouth/Throat:     Mouth: Mucous membranes are moist.  Eyes:     General: No scleral icterus.    Conjunctiva/sclera: Conjunctivae normal.  Cardiovascular:     Rate and Rhythm: Normal rate and regular rhythm.     Heart sounds: Normal heart sounds. No murmur heard.    No  friction rub. No gallop.  Pulmonary:     Effort: Pulmonary effort is normal. No respiratory distress.     Breath sounds: Normal breath sounds.  Abdominal:     General: Bowel sounds are normal. There is no distension.     Palpations: Abdomen is soft. There is no mass.     Tenderness: There is no abdominal tenderness. There is no guarding.  Musculoskeletal:     Cervical back: Normal range of motion.  Skin:    General: Skin is warm and dry.     Findings: Rash present.     Comments: Bullous eruption on the left index finger. Multiple small vesicular eruptions over bilateral eyes with swelling of the medial canthus and upper lids.  Neurological:     Mental Status: She is alert and oriented to person, place, and time.  Psychiatric:        Behavior: Behavior normal.     ED Results / Procedures / Treatments   Labs (all labs ordered are listed, but only abnormal results are displayed) Labs Reviewed - No data to display  EKG None  Radiology No results found.  Procedures Procedures    Medications Ordered in  ED Medications - No data to display  ED Course/ Medical Decision Making/ A&P                                 Medical Decision Making Patient here with poison ivy dermatitis.  No signs of secondary infection. Patient will be treated with 2-week steroid taper is indicated for facial involvement of contact dermatitis. Gust outpatient follow-up and return precautions.  Appropriate for discharge at this time.  Reviewed patient's allergies she has no allergy to or intolerance to steroids and is not diabetic.           Final Clinical Impression(s) / ED Diagnoses Final diagnoses:  Poison oak dermatitis    Rx / DC Orders ED Discharge Orders     None         Arthor Captain, PA-C 02/26/23 1115    Terald Sleeper, MD 02/26/23 [phone]

## 2023-02-26 NOTE — ED Notes (Signed)
Dc instructions reviewed with patient. Patient voiced understanding. Dc with belongings.  °

## 2023-10-28 DIAGNOSIS — H04123 Dry eye syndrome of bilateral lacrimal glands: Secondary | ICD-10-CM | POA: Diagnosis not present

## 2023-10-28 DIAGNOSIS — H43812 Vitreous degeneration, left eye: Secondary | ICD-10-CM | POA: Diagnosis not present

## 2023-10-28 DIAGNOSIS — H2513 Age-related nuclear cataract, bilateral: Secondary | ICD-10-CM | POA: Diagnosis not present

## 2023-10-28 DIAGNOSIS — H40013 Open angle with borderline findings, low risk, bilateral: Secondary | ICD-10-CM | POA: Diagnosis not present

## 2023-10-31 ENCOUNTER — Ambulatory Visit (HOSPITAL_BASED_OUTPATIENT_CLINIC_OR_DEPARTMENT_OTHER): Payer: Federal, State, Local not specified - PPO | Admitting: Radiology

## 2023-11-03 ENCOUNTER — Ambulatory Visit (HOSPITAL_BASED_OUTPATIENT_CLINIC_OR_DEPARTMENT_OTHER): Admitting: Radiology

## 2023-11-07 ENCOUNTER — Encounter (HOSPITAL_BASED_OUTPATIENT_CLINIC_OR_DEPARTMENT_OTHER): Payer: Self-pay | Admitting: Radiology

## 2023-11-07 ENCOUNTER — Ambulatory Visit (HOSPITAL_BASED_OUTPATIENT_CLINIC_OR_DEPARTMENT_OTHER)
Admission: RE | Admit: 2023-11-07 | Discharge: 2023-11-07 | Disposition: A | Discharge status: Home / Self Care | Source: Ambulatory Visit | Attending: Obstetrics & Gynecology | Admitting: Obstetrics & Gynecology

## 2023-11-07 DIAGNOSIS — Z1231 Encounter for screening mammogram for malignant neoplasm of breast: Secondary | ICD-10-CM | POA: Diagnosis not present

## 2024-01-16 ENCOUNTER — Ambulatory Visit (HOSPITAL_BASED_OUTPATIENT_CLINIC_OR_DEPARTMENT_OTHER): Payer: Self-pay | Admitting: Obstetrics & Gynecology

## 2024-02-02 ENCOUNTER — Ambulatory Visit (HOSPITAL_BASED_OUTPATIENT_CLINIC_OR_DEPARTMENT_OTHER): Admitting: Obstetrics & Gynecology

## 2024-02-28 ENCOUNTER — Ambulatory Visit (INDEPENDENT_AMBULATORY_CARE_PROVIDER_SITE_OTHER): Payer: Federal, State, Local not specified - PPO | Admitting: Internal Medicine

## 2024-02-28 ENCOUNTER — Encounter: Payer: Self-pay | Admitting: Internal Medicine

## 2024-02-28 VITALS — BP 120/90 | HR 74 | Temp 97.9°F | Ht 69.9 in | Wt 226.2 lb

## 2024-02-28 DIAGNOSIS — Z0000 Encounter for general adult medical examination without abnormal findings: Secondary | ICD-10-CM

## 2024-02-28 DIAGNOSIS — E785 Hyperlipidemia, unspecified: Secondary | ICD-10-CM

## 2024-02-28 DIAGNOSIS — Z1159 Encounter for screening for other viral diseases: Secondary | ICD-10-CM | POA: Diagnosis not present

## 2024-02-28 DIAGNOSIS — Z1211 Encounter for screening for malignant neoplasm of colon: Secondary | ICD-10-CM

## 2024-02-28 LAB — COMPREHENSIVE METABOLIC PANEL WITH GFR
ALT: 42 U/L — ABNORMAL HIGH (ref 0–35)
AST: 25 U/L (ref 0–37)
Albumin: 4.5 g/dL (ref 3.5–5.2)
Alkaline Phosphatase: 75 U/L (ref 39–117)
BUN: 16 mg/dL (ref 6–23)
CO2: 29 meq/L (ref 19–32)
Calcium: 10 mg/dL (ref 8.4–10.5)
Chloride: 104 meq/L (ref 96–112)
Creatinine, Ser: 0.84 mg/dL (ref 0.40–1.20)
GFR: 77 mL/min (ref >60.00)
Glucose, Bld: 93 mg/dL (ref 70–99)
Potassium: 4.3 meq/L (ref 3.5–5.1)
Sodium: 140 meq/L (ref 135–145)
Total Bilirubin: 0.5 mg/dL (ref 0.2–1.2)
Total Protein: 7.4 g/dL (ref 6.0–8.3)

## 2024-02-28 LAB — LIPID PANEL
Cholesterol: 248 mg/dL — ABNORMAL HIGH (ref 0–200)
HDL: 44.9 mg/dL (ref >39.00)
LDL Cholesterol: 154 mg/dL — ABNORMAL HIGH (ref 0–99)
NonHDL: 203.34
Total CHOL/HDL Ratio: 6
Triglycerides: 245 mg/dL — ABNORMAL HIGH (ref 0.0–149.0)
VLDL: 49 mg/dL — ABNORMAL HIGH (ref 0.0–40.0)

## 2024-02-28 LAB — CBC WITH DIFFERENTIAL/PLATELET
Basophils Absolute: 0 K/uL (ref 0.0–0.1)
Basophils Relative: 0.7 % (ref 0.0–3.0)
Eosinophils Absolute: 0.4 K/uL (ref 0.0–0.7)
Eosinophils Relative: 5.8 % — ABNORMAL HIGH (ref 0.0–5.0)
HCT: 42.9 % (ref 36.0–46.0)
Hemoglobin: 14.4 g/dL (ref 12.0–15.0)
Lymphocytes Relative: 53.5 % — ABNORMAL HIGH (ref 12.0–46.0)
Lymphs Abs: 3.4 K/uL (ref 0.7–4.0)
MCHC: 33.5 g/dL (ref 30.0–36.0)
MCV: 93.7 fl (ref 78.0–100.0)
Monocytes Absolute: 0.4 K/uL (ref 0.1–1.0)
Monocytes Relative: 6.4 % (ref 3.0–12.0)
Neutro Abs: 2.1 K/uL (ref 1.4–7.7)
Neutrophils Relative %: 33.6 % — ABNORMAL LOW (ref 43.0–77.0)
Platelets: 230 K/uL (ref 150.0–400.0)
RBC: 4.58 Mil/uL (ref 3.87–5.11)
RDW: 13.1 % (ref 11.5–15.5)
WBC: 6.3 K/uL (ref 4.0–10.5)

## 2024-02-28 LAB — TSH: TSH: 3.39 u[IU]/mL (ref 0.35–5.50)

## 2024-02-28 LAB — HEMOGLOBIN A1C: Hgb A1c MFr Bld: 5.9 % (ref 4.6–6.5)

## 2024-02-28 NOTE — Patient Instructions (Signed)
 Good to see you today  Yes attention to  healthy weight control and eating   and exercise advised Cholesterol studies today .    Changet pneumococcal vaccine   any time but maybe when 60 or 65  options.

## 2024-02-28 NOTE — Progress Notes (Signed)
 Chief Complaint  Patient presents with   Annual Exam    Pt is fasting.     HPI: Patient  Olivia Hill  58 y.o. comes in today for Preventive Health Care visit  Doing ok retired for 3 years thinking about going back short term  Restarting exrecise program and best eating habits     Health Maintenance  Topic Date Due   HIV Screening  Never done   Hepatitis C Screening  Never done   Hepatitis B Vaccines (1 of 3 - 19+ 3-dose series) Never done   Pneumococcal Vaccine: 50+ Years (1 of 1 - PCV) Never done   Fecal DNA (Cologuard)  11/19/2023   INFLUENZA VACCINE  02/24/2024   COVID-19 Vaccine (3 - 2024-25 season) 03/15/2024 (Originally 03/27/2023)   MAMMOGRAM  11/06/2025   Cervical Cancer Screening (HPV/Pap Cotest)  01/05/2026   DTaP/Tdap/Td (3 - Td or Tdap) 01/12/2033   Zoster Vaccines- Shingrix  Completed   HPV VACCINES  Aged Out   Meningococcal B Vaccine  Aged Out   Health Maintenance Review LIFESTYLE:  Exercise:   started back  n[place] track  walking .  To begin resistance  Tobacco/ETS: n Alcohol:  n Sugar beverages: gatorade zero. Ocass unsweet  tea.  Sleep:  good sleep  Drug use: no HH of 1  prev with parents.  Work: retired for 3 years and asked  to come back.     ROS:  some soreness under left jaw line  not all the time and nos swelling or worsening  no dental problems  GEN/ HEENT: No fever, significant weight changes sweats headaches vision problems hearing changes, CV/ PULM; No chest pain shortness of breath cough, syncope,edema  change in exercise tolerance. GI /GU: No adominal pain, vomiting, change in bowel habits. No blood in the stool. No significant GU symptoms. SKIN/HEME: ,no acute skin rashes suspicious lesions or bleeding. No lymphadenopathy, nodules, masses.  NEURO/ PSYCH:  No neurologic signs such as weakness numbness. No depression anxiety. IMM/ Allergy: No unusual infections.  Allergy .   REST of 12 system review negative except as per  HPI   Past Medical History:  Diagnosis Date   Bell's palsy 12/2018   Environmental and seasonal allergies    Hx of varicella    Hypercholesterolemia 12/13   on medication   Recurrent sinusitis    neg ct 2013    Skin cancer of face    cryotherapy done fall 2015   Vertigo     Past Surgical History:  Procedure Laterality Date   SHOULDER SURGERY Left 03/26/2008   torn labrum 3 pins   URETHRA SURGERY  07/26/1970    Family History  Problem Relation Age of Onset   Heart disease Mother    Stroke Mother        in her 41s    Migraines Mother    Hypertension Father    Gout Father    Healthy Father        age 42   Heart attack Father    Healthy Brother        2    Social History   Socioeconomic History   Marital status: Single    Spouse name: Not on file   Number of children: Not on file   Years of education: Not on file   Highest education level: Master's degree (e.g., MA, MS, MEng, MEd, MSW, MBA)  Occupational History   Not on file  Tobacco Use   Smoking  status: Never   Smokeless tobacco: Never  Vaping Use   Vaping status: Never Used  Substance and Sexual Activity   Alcohol use: No   Drug use: No   Sexual activity: Not Currently    Partners: Male    Birth control/protection: Post-menopausal  Other Topics Concern   Not on file  Social History Narrative   6-8 hours of sleep per night   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   Lives alone with 2 cats   hh of 1 [place]as[place] job related    G0P0   Social Drivers of Health   Financial Resource Strain: Low Risk  (08/04/2022)   Overall Financial Resource Strain (CARDIA)    Difficulty of Paying Living Expenses: Not hard at all  Food Insecurity: No Food Insecurity (08/04/2022)   Hunger Vital Sign    Worried About Running Out of Food in the Last Year: Never true    Ran Out of Food in the Last Year: Never true  Transportation Needs: No Transportation Needs (08/04/2022)    PRAPARE - Administrator, Civil Service (Medical): No    Lack of Transportation (Non-Medical): No  Physical Activity: Unknown (08/04/2022)   Exercise Vital Sign    Days of Exercise per Week: 0 days    Minutes of Exercise per Session: Not on file  Stress: No Stress Concern Present (08/04/2022)   [employer] of Occupational Health - Occupational Stress Questionnaire    Feeling of Stress : Only a little  Social Connections: Moderately Integrated (08/04/2022)   Social Connection and Isolation Panel    Frequency of Communication with Friends and Family: More than three times a week    Frequency of Social Gatherings with Friends and Family: More than three times a week    Attends Religious Services: More than 4 times per year    Active Member of Clubs or Organizations: Yes    Attends Engineer, structural: More than 4 times per year    Marital Status: Never married    Outpatient Medications Prior to Visit  Medication Sig Dispense Refill   cetirizine (ZYRTEC) 10 MG tablet Take 1 tablet by mouth daily as needed.     predniSONE  (DELTASONE ) 10 MG tablet Take 6 tabs by mouth daily  for 2 days, then 5 tabs for 2 days, then 4 tabs for 2 days, then 3 tabs for 2 days, 2 tabs for 2 days, then 1 tab by mouth daily for 2 days 42 tablet 0   No facility-administered medications prior to visit.     EXAM:  BP (!) 120/90 (BP Location: Left Arm, Patient Position: Sitting, Cuff Size: Large)   Pulse 74   Temp 97.9 F (36.6 C) (Oral)   Ht 5' 9.9 (1.775 m)   Wt 226 lb 3.2 oz (102.6 kg)   LMP 10/04/2015 Comment: last spotting was 1/17  SpO2 96%   BMI 32.55 kg/m   Body mass index is 32.55 kg/m. Wt Readings from Last 3 Encounters:  02/28/24 226 lb 3.2 oz (102.6 kg)  02/26/23 215 lb (97.5 kg)  01/13/23 214 lb 3.2 oz (97.2 kg)    Physical Exam: Vital signs reviewed HZW:Uypd is a well-developed well-nourished alert cooperative    who appearsr stated age in no acute distress.   HEENT: normocephalic atraumatic , Eyes: PERRL EOM's full, conjunctiva clear, Nares: paten,t no deformity discharge or tenderness., Ears: no deformity EAC's clear  TMs with normal landmarks. Mouth: clear OP, no lesions, edema.  Moist mucous membranes. Dentition in adequate repair. NECK: supple without masses, thyromegaly or bruits. ? Min tender left sm gland?  No redness no ant adenopathy  CHEST/PULM:  Clear to auscultation and percussion breath sounds equal no wheeze , rales or rhonchi. No chest wall deformities or tenderness. Breast: normal by inspection . No dimpling, discharge, masses, tenderness or discharge . CV: PMI is nondisplaced, S1 S2 no gallops, murmurs, rubs. Peripheral pulses are full without delay.No JVD .  ABDOMEN: Bowel sounds normal nontender  No guard or rebound, no hepato splenomegal no CVA tenderness.  No hernia. Extremtities:  No clubbing cyanosis or edema, no acute joint swelling or redness no focal atrophy NEURO:  Oriented x3, cranial nerves 3-12 appear to be intact, no obvious focal weakness,gait within normal limits no abnormal reflexes or asymmetrical SKIN: No acute rashes normal turgor, color, no bruising or petechiae. PSYCH: Oriented, good eye contact, no obvious depression anxiety, cognition and judgment appear normal. LN: no cervical axillary adenopathy  Lab Results  Component Value Date   WBC 6.3 02/28/2024   HGB 14.4 02/28/2024   HCT 42.9 02/28/2024   PLT 230.0 02/28/2024   GLUCOSE 93 02/28/2024   CHOL 248 (H) 02/28/2024   TRIG 245.0 (H) 02/28/2024   HDL 44.90 02/28/2024   LDLDIRECT 180.0 12/22/2021   LDLCALC 154 (H) 02/28/2024   ALT 42 (H) 02/28/2024   AST 25 02/28/2024   NA 140 02/28/2024   K 4.3 02/28/2024   CL 104 02/28/2024   CREATININE 0.84 02/28/2024   BUN 16 02/28/2024   CO2 29 02/28/2024   TSH 3.39 02/28/2024   HGBA1C 5.9 02/28/2024    BP Readings from Last 3 Encounters:  02/28/24 (!) 120/90  02/26/23 134/82  01/13/23 122/75    Lab  plan reviewed with patient  fasting   ASSESSMENT AND PLAN:  Discussed the following assessment and plan:    ICD-10-CM   1. Well adult exam  Z00.00 CBC with Differential/Platelet    Comprehensive metabolic panel with GFR    Lipid panel    TSH    Hemoglobin A1c    HIV antibody (with reflex)    Hep C Antibody    Lipoprotein A (LPA)    2. Screening for colon cancer  Z12.11 Cologuard    3. Need for hepatitis C screening test  Z11.59 Hep C Antibody    4. Hyperlipidemia, unspecified hyperlipidemia type  E78.5 Lipid panel    Lipoprotein A (LPA)      cv fam hx parents in 28s  father has pacer and had mi in 42s  She had ct calcium  score  0 in past  Optimize  healthy eating and exercise as planned   bp goal  Fam hx neg colon cancer  cologuard ordered. Agree with attention to intensified lsi  avoiding processed foods simple sugars adding resistance exercise . Healthy weight control.  Believe she had hep b series  in past  Disc prevnar and will wait on this today  not high risk  Get good dental exam and address the sometimes soreness left sb area  and if  persistent or progressive  plan a fu if indicated   Return in about 1 year (around 02/27/2025) for depending on results.  Patient Care Team: Shizue Kaseman, Apolinar POUR, MD as PCP - General (Internal Medicine) Hicks, Roselyn M, MD (Inactive) as Consulting Physician (Allergy and Immunology) Rodgers Barnie RAMAN, CNM as Referring Physician (Certified Nurse  Midwife) Joshua Sieving, MD as Consulting Physician (Dermatology) sandra fuller dds  Charlott Laymon Mingle, MD as Referring Physician (Internal Medicine) Patient Instructions  Good to see you today  Yes attention to  healthy weight control and eating   and exercise advised Cholesterol studies today .    Changet pneumococcal vaccine   any time but maybe when 60 or 65  options.   Senita Corredor K. Genever Hentges M.D.

## 2024-03-01 ENCOUNTER — Ambulatory Visit: Payer: Self-pay | Admitting: Internal Medicine

## 2024-03-01 DIAGNOSIS — E785 Hyperlipidemia, unspecified: Secondary | ICD-10-CM

## 2024-03-01 DIAGNOSIS — Z79899 Other long term (current) drug therapy: Secondary | ICD-10-CM

## 2024-03-01 NOTE — Progress Notes (Signed)
 Cholesterol not optimal but better than last time  No diabetes  Minor elevation of a liver test .  Many causes  meds fatty diet  medications alcohol etc advice is to follow  Intensify lifestyle interventions.  Healthy eating and exercise As we discussed  Plan lfts and lipids  fasting in 3-4 months  to follow up

## 2024-03-03 LAB — HEPATITIS C ANTIBODY: Hepatitis C Ab: NONREACTIVE

## 2024-03-03 LAB — HIV ANTIBODY (ROUTINE TESTING W REFLEX): HIV 1&2 Ab, 4th Generation: NONREACTIVE

## 2024-03-03 LAB — LIPOPROTEIN A (LPA): Lipoprotein (a): 16 nmol/L (ref <75)

## 2024-03-11 LAB — COLOGUARD: COLOGUARD: NEGATIVE

## 2024-03-13 ENCOUNTER — Ambulatory Visit (HOSPITAL_BASED_OUTPATIENT_CLINIC_OR_DEPARTMENT_OTHER): Admitting: Obstetrics & Gynecology

## 2024-03-27 DIAGNOSIS — L738 Other specified follicular disorders: Secondary | ICD-10-CM | POA: Diagnosis not present

## 2024-03-27 DIAGNOSIS — L821 Other seborrheic keratosis: Secondary | ICD-10-CM | POA: Diagnosis not present

## 2024-03-27 DIAGNOSIS — L814 Other melanin hyperpigmentation: Secondary | ICD-10-CM | POA: Diagnosis not present

## 2024-03-27 DIAGNOSIS — L820 Inflamed seborrheic keratosis: Secondary | ICD-10-CM | POA: Diagnosis not present

## 2024-05-01 DIAGNOSIS — D0439 Carcinoma in situ of skin of other parts of face: Secondary | ICD-10-CM | POA: Diagnosis not present

## 2024-05-08 ENCOUNTER — Other Ambulatory Visit (HOSPITAL_COMMUNITY)
Admission: RE | Admit: 2024-05-08 | Discharge: 2024-05-08 | Disposition: A | Discharge status: Home / Self Care | Source: Ambulatory Visit | Attending: Obstetrics & Gynecology | Admitting: Obstetrics & Gynecology

## 2024-05-08 ENCOUNTER — Encounter (HOSPITAL_BASED_OUTPATIENT_CLINIC_OR_DEPARTMENT_OTHER): Payer: Self-pay | Admitting: Obstetrics & Gynecology

## 2024-05-08 ENCOUNTER — Ambulatory Visit (INDEPENDENT_AMBULATORY_CARE_PROVIDER_SITE_OTHER): Admitting: Obstetrics & Gynecology

## 2024-05-08 VITALS — BP 122/67 | HR 66 | Ht 71.0 in | Wt 223.4 lb

## 2024-05-08 DIAGNOSIS — Z780 Asymptomatic menopausal state: Secondary | ICD-10-CM | POA: Insufficient documentation

## 2024-05-08 DIAGNOSIS — Z01419 Encounter for gynecological examination (general) (routine) without abnormal findings: Secondary | ICD-10-CM | POA: Diagnosis not present

## 2024-05-08 DIAGNOSIS — Z124 Encounter for screening for malignant neoplasm of cervix: Secondary | ICD-10-CM

## 2024-05-08 DIAGNOSIS — Z1151 Encounter for screening for human papillomavirus (HPV): Secondary | ICD-10-CM | POA: Diagnosis not present

## 2024-05-08 NOTE — Progress Notes (Signed)
 ANNUAL EXAM Patient name: Olivia Hill MRN [id number]  Date of birth: Jan 13, 1966 Chief Complaint:   Gynecologic Exam (Pt reports no issues or concerns today. )  History of Present Illness:   Olivia Hill is a 58 y.o. G0P0000 Caucasian female being seen today for a routine annual exam.  Doing well.  Denies vaginal bleeding.    Patient's last menstrual period was 10/04/2015.   Last pap 01/05/2021. Results were: NILM w/ HRHPV negative. H/O abnormal pap: no Last mammogram: 11/07/2023. Results were: normal. Family h/o breast cancer: no Last colonoscopy: Patient had Cologuard screening on 03/05/2024 that was negative. Family h/o colorectal cancer: no     05/08/2024    3:37 PM 02/28/2024   10:36 AM 01/13/2023   11:07 AM 08/04/2022    2:53 PM 01/07/2022   10:45 AM  Depression screen PHQ 2/9  Decreased Interest 0 0 0 0 0  Down, Depressed, Hopeless 0 0 0 0 0  PHQ - 2 Score 0 0 0 0 0  Altered sleeping    1   Tired, decreased energy    1   Change in appetite    0   Feeling bad or failure about yourself     0   Trouble concentrating    1   Moving slowly or fidgety/restless    0   Suicidal thoughts    0   PHQ-9 Score    3   Difficult doing work/chores    Somewhat difficult     Review of Systems:   Pertinent items are noted in HPI Denies any bladder or bowel changes.  Denies pelvic pain.  Pertinent History Reviewed:  Reviewed past medical,surgical, social and family history.  Reviewed problem list, medications and allergies. Physical Assessment:   Vitals:   05/08/24 1537  BP: 122/67  Pulse: 66  SpO2: 100%  Weight: 223 lb 6.4 oz (101.3 kg)  Height: 5' 11 (1.803 m)  Body mass index is 31.16 kg/m.        Physical Examination:   General appearance - well appearing, and in no distress  Mental status - alert, oriented to person, place, and time  Psych:  She has a normal mood and affect  Skin - warm and dry, normal color, no suspicious lesions noted  Chest - effort  normal, all lung fields clear to auscultation bilaterally  Heart - normal rate and regular rhythm  Neck:  midline trachea, no thyromegaly or nodules  Breasts - breasts appear normal, no suspicious masses, no skin or nipple changes or  axillary nodes  Abdomen - soft, nontender, nondistended, no masses or organomegaly  Pelvic - VULVA: normal appearing vulva with no masses, tenderness or lesions   VAGINA: normal appearing vagina with normal color and discharge, no lesions   CERVIX: normal appearing cervix without discharge or lesions, no CMT  Thin prep pap is updated today  UTERUS: uterus is felt to be normal size, shape, consistency and nontender   ADNEXA: No adnexal masses or tenderness noted.  Rectal - normal rectal, good sphincter tone, no masses felt.   Extremities:  No swelling or varicosities noted  Chaperone present for exam  No results found for this or any previous visit (from the past 24 hours).  Assessment & Plan:  1. Well woman exam with routine gynecological exam (Primary) - Pap smear updated today with HR HPV - Mammogram 11/10/2023 - Colonoscopy declined but had negative cologuard this year - Bone mineral density will be planned after  age 67 - lab work done with PCP, Dr. Charlett this summer.  Reviewed results. - vaccines reviewed/updated 2. Cervical cancer screening - Cytology - PAP( [facility])  3. Postmenopausal - not on HRT   No orders of the defined types were placed in this encounter.   Meds: No orders of the defined types were placed in this encounter.   Follow-up: Return in about 1 year (around 05/08/2025).  Ronal GORMAN Pinal, MD 05/08/2024 4:28 PM

## 2024-05-11 ENCOUNTER — Ambulatory Visit (HOSPITAL_BASED_OUTPATIENT_CLINIC_OR_DEPARTMENT_OTHER): Payer: Self-pay | Admitting: Obstetrics & Gynecology

## 2024-05-11 LAB — CYTOLOGY - PAP
Adequacy: ABSENT
Comment: NEGATIVE
Diagnosis: NEGATIVE
High risk HPV: NEGATIVE

## 2024-07-31 ENCOUNTER — Ambulatory Visit: Payer: Self-pay

## 2024-07-31 NOTE — Telephone Encounter (Signed)
 FYI Only or Action Required?: FYI only for provider: appointment scheduled on 08/01/24.  Patient was last seen in primary care on 02/28/2024 by Panosh, Apolinar POUR, MD.  Called Nurse Triage reporting Dizziness, Cough, and Fatigue.  Symptoms began several days ago.  Interventions attempted: Rest, hydration, or home remedies.  Symptoms are: unchanged.  Triage Disposition: See Physician Within 24 Hours  Patient/caregiver understands and will follow disposition?: Yes  Reason for Disposition  [1] MODERATE dizziness (e.g., interferes with normal activities) AND [2] has NOT been evaluated by doctor (or NP/PA) for this  (Exception: Dizziness caused by heat exposure, sudden standing, or poor fluid intake.)  Answer Assessment - Initial Assessment Questions Patient states that she started to have cough about 2 weeks ago that has not subsided. She states it is non-productive and she feels like she has some chest congestion. She did experience some wheezing for about 3 nights previously, but it has subsided. She is concerned today about dizziness she has been experiencing. She states that she has felt moderately dizzy with lightheadedness and unable to stand when waking up Sunday and Monday mornings. She states that this feels different from Vertigo. She is not currently feeling dizzy during triage. Office visit advised.   1. DESCRIPTION: Describe your dizziness.     Woozy  2. LIGHTHEADED: Do you feel lightheaded? (e.g., somewhat faint, woozy, weak upon standing)     Yes  3. VERTIGO: Do you feel like either you or the room is spinning or tilting? (i.e., vertigo)     No  4. SEVERITY: How bad is it?  Do you feel like you are going to faint? Can you stand and walk?     Moderate  5. ONSET:  When did the dizziness begin?     Sunday  6. AGGRAVATING FACTORS: Does anything make it worse? (e.g., standing, change in head position)     Moving around  7. HEART RATE: Can you tell me your heart  rate? How many beats in 15 seconds?  (Note: Not all patients can do this.)       No, unable to check  8. CAUSE: What do you think is causing the dizziness? (e.g., decreased fluids or food, diarrhea, emotional distress, heat exposure, new medicine, sudden standing, vomiting; unknown)     Considered her blood sugar, but unable to check  9. RECURRENT SYMPTOM: Have you had dizziness before? If Yes, ask: When was the last time? What happened that time?     No  10. OTHER SYMPTOMS: Do you have any other symptoms? (e.g., fever, chest pain, vomiting, diarrhea, bleeding)       Denies any other symptoms  11. PREGNANCY: Is there any chance you are pregnant? When was your last menstrual period?       NA  Protocols used: Dizziness - Lightheadedness-A-AH  Copied from CRM [id number]. Topic: Clinical - Red Word Triage >> Jul 31, 2024  5:30 PM Drema MATSU wrote: Red Word that prompted transfer to Nurse Triage: Pt has been sick for over 2 weeks now. She states that her chest feels heavy, dizzy for the past 2 mornings, and always tired.

## 2024-08-01 ENCOUNTER — Ambulatory Visit: Admitting: Family Medicine

## 2024-08-01 ENCOUNTER — Encounter: Payer: Self-pay | Admitting: Family Medicine

## 2024-08-01 VITALS — BP 134/72 | HR 65 | Temp 98.1°F | Wt 226.2 lb

## 2024-08-01 DIAGNOSIS — R42 Dizziness and giddiness: Secondary | ICD-10-CM

## 2024-08-01 NOTE — Telephone Encounter (Signed)
 Has appt with Dr. Micheal on 08/01/2024 with Dr. Micheal.

## 2024-08-01 NOTE — Progress Notes (Signed)
 "  Established Patient Office Visit  Subjective   Patient ID: Olivia Hill, female    DOB: 1966/03/27  Age: 59 y.o. MRN: [id number]  Chief Complaint  Patient presents with   Dizziness   Cough    HPI    Olivia Hill is seen today as a work in with some nonspecific dizziness for couple days but none currently.  She states that Tuesday for Christmas she developed a bad cold.  She had some head congestion and cough.  This past Sunday and Monday morning when she got up she had some dizziness.  She has had vertigo previously but states this was different.  This was more of a lightheadedness.  There was no syncope.  No palpitations.  No chest pains.  No shortness of breath.  Denies any nausea, vomiting, or diarrhea.  Has had some nonspecific malaise past several days.  She drank some orange juice Sunday and Monday mornings and felt better afterwards.  She has no history of diabetes and does not take any drugs that would induce hypoglycemia.  She takes no regular medications.  No dysuria.  Past Medical History:  Diagnosis Date   Bell's palsy 12/2018   Environmental and seasonal allergies    Hx of varicella    Hypercholesterolemia 12/13   on medication   Recurrent sinusitis    neg ct 2013    Skin cancer of face    cryotherapy done fall 2015   Vertigo    Past Surgical History:  Procedure Laterality Date   SHOULDER SURGERY Left 03/26/2008   torn labrum 3 pins   URETHRA SURGERY  07/26/1970    reports that she has never smoked. She has never used smokeless tobacco. She reports that she does not drink alcohol and does not use drugs. family history includes Gout in her father; Healthy in her brother and father; Heart attack in her father; Heart disease in her mother; Hypertension in her father; Migraines in her mother; Stroke in her mother. Allergies[1]  Review of Systems  Constitutional:  Positive for malaise/fatigue. Negative for chills and fever.  Respiratory:  Negative for shortness of  breath.   Cardiovascular:  Negative for chest pain.  Genitourinary:  Negative for dysuria.  Neurological:  Positive for dizziness. Negative for sensory change, speech change, focal weakness and headaches.      Objective:     BP 134/72   Pulse 65   Temp 98.1 F (36.7 C) (Oral)   Wt 226 lb 3.2 oz (102.6 kg)   LMP 10/04/2015 Comment: last spotting was 1/17  SpO2 96%   BMI 31.55 kg/m  BP Readings from Last 3 Encounters:  08/01/24 134/72  05/08/24 122/67  02/28/24 (!) 120/90   Wt Readings from Last 3 Encounters:  08/01/24 226 lb 3.2 oz (102.6 kg)  05/08/24 223 lb 6.4 oz (101.3 kg)  02/28/24 226 lb 3.2 oz (102.6 kg)      Physical Exam Vitals reviewed.  Constitutional:      General: She is not in acute distress.    Appearance: She is not ill-appearing or toxic-appearing.  Eyes:     Extraocular Movements: Extraocular movements intact.     Pupils: Pupils are equal, round, and reactive to light.  Neck:     Comments: No carotid bruits Cardiovascular:     Rate and Rhythm: Normal rate and regular rhythm.     Heart sounds: No murmur heard. Pulmonary:     Effort: Pulmonary effort is normal.     Breath  sounds: Normal breath sounds. No wheezing or rales.  Musculoskeletal:     Cervical back: Neck supple.     Right lower leg: No edema.     Left lower leg: No edema.  Skin:    Findings: No rash.  Neurological:     General: No focal deficit present.     Mental Status: She is alert and oriented to person, place, and time.     Cranial Nerves: No cranial nerve deficit.     Motor: No weakness.     Coordination: Coordination normal.     Gait: Gait normal.      No results found for any visits on 08/01/24.    The 10-year ASCVD risk score (Arnett DK, et al., 2019) is: 4.2%    Assessment & Plan:   59 year old female with recent viral URI who is seen with transient dizziness Sunday morning morning.  She describes this as being distinctly different from previous vertigo.  More  lightheadedness.  Blood pressure today left arm seated 118/78 and standing 124/80.  Nonfocal exam.  Symptoms improved both days following our juice but she has no reason to have any significant hypoglycemia.  She has had no symptoms past couple days.  Stressed importance of adequate hydration and follow-up with primary if she has any recurrent dizziness or other concerns.  She had multiple labs done last August which are relatively normal.  A1c slightly elevated 5.9%.   Olivia Scarlet, MD     [1]  Allergies Allergen Reactions   Nyquil Severe Cold-Flu  [Phenyleph-Doxylamine-Dm-Apap] Palpitations   "

## 2024-08-06 ENCOUNTER — Other Ambulatory Visit (INDEPENDENT_AMBULATORY_CARE_PROVIDER_SITE_OTHER)

## 2024-08-06 ENCOUNTER — Ambulatory Visit: Payer: Self-pay | Admitting: Internal Medicine

## 2024-08-06 DIAGNOSIS — Z79899 Other long term (current) drug therapy: Secondary | ICD-10-CM | POA: Diagnosis not present

## 2024-08-06 DIAGNOSIS — E785 Hyperlipidemia, unspecified: Secondary | ICD-10-CM

## 2024-08-06 DIAGNOSIS — R7989 Other specified abnormal findings of blood chemistry: Secondary | ICD-10-CM

## 2024-08-06 LAB — HEPATIC FUNCTION PANEL
ALT: 40 U/L — ABNORMAL HIGH (ref 3–35)
AST: 20 U/L (ref 5–37)
Albumin: 4.3 g/dL (ref 3.5–5.2)
Alkaline Phosphatase: 67 U/L (ref 39–117)
Bilirubin, Direct: 0.1 mg/dL (ref 0.1–0.3)
Total Bilirubin: 0.4 mg/dL (ref 0.2–1.2)
Total Protein: 6.8 g/dL (ref 6.0–8.3)

## 2024-08-06 LAB — LIPID PANEL
Cholesterol: 218 mg/dL — ABNORMAL HIGH (ref 28–200)
HDL: 38.8 mg/dL — ABNORMAL LOW
LDL Cholesterol: 145 mg/dL — ABNORMAL HIGH (ref 10–99)
NonHDL: 179.29
Total CHOL/HDL Ratio: 6
Triglycerides: 170 mg/dL — ABNORMAL HIGH (ref 10.0–149.0)
VLDL: 34 mg/dL (ref 0.0–40.0)

## 2024-08-06 NOTE — Progress Notes (Signed)
 Minor abnormalities  persist  this could be  from fatty marbling of liver as a most common cause as well as medications.   Other causes less common  Cholesterol still suboptimals . But continuing to improve.    I suggest we get a liver ultrasound to be sure  no lesions of sort and then fu appt   to review .results   If agrees  please order  abd RUQ liver ultrasound  for abnormal lfts

## 2024-08-13 ENCOUNTER — Ambulatory Visit
Admission: RE | Admit: 2024-08-13 | Discharge: 2024-08-13 | Disposition: A | Discharge status: Home / Self Care | Source: Ambulatory Visit | Attending: Internal Medicine | Admitting: Internal Medicine

## 2024-08-13 DIAGNOSIS — R7989 Other specified abnormal findings of blood chemistry: Secondary | ICD-10-CM | POA: Diagnosis present

## 2024-08-15 ENCOUNTER — Ambulatory Visit: Payer: Self-pay | Admitting: Internal Medicine

## 2024-08-15 ENCOUNTER — Other Ambulatory Visit: Payer: Self-pay | Admitting: Internal Medicine

## 2024-08-15 DIAGNOSIS — K760 Fatty (change of) liver, not elsewhere classified: Secondary | ICD-10-CM

## 2024-08-15 DIAGNOSIS — R7989 Other specified abnormal findings of blood chemistry: Secondary | ICD-10-CM

## 2024-08-15 NOTE — Progress Notes (Signed)
 So liver shows probably  fatty marbling but no lesions  and also a gall stone ( if no sx would just follow)   would do fu labs to include iron levels  and follow .  Measures that help cholesterol heart healthy eating exercise  . We may also consider adding a statin medication for the lipid control .  I will place lab orders to do in the next 2 -3 months .  And then fu appt  virtual or in person
